# Patient Record
Sex: Male | Born: 1935 | Race: Black or African American | Hispanic: No | Marital: Married | Smoking: Former smoker
Health system: Southern US, Community
[De-identification: ages and names within clinical notes are randomized; demographics above are authoritative.]

## PROBLEM LIST (undated history)

## (undated) DIAGNOSIS — G40909 Epilepsy, unspecified, not intractable, without status epilepticus: Secondary | ICD-10-CM

## (undated) DIAGNOSIS — I739 Peripheral vascular disease, unspecified: Secondary | ICD-10-CM

## (undated) DIAGNOSIS — Z923 Personal history of irradiation: Secondary | ICD-10-CM

## (undated) DIAGNOSIS — R350 Frequency of micturition: Secondary | ICD-10-CM

## (undated) DIAGNOSIS — C411 Malignant neoplasm of mandible: Secondary | ICD-10-CM

## (undated) DIAGNOSIS — F17200 Nicotine dependence, unspecified, uncomplicated: Secondary | ICD-10-CM

## (undated) DIAGNOSIS — G20 Parkinson's disease: Secondary | ICD-10-CM

## (undated) DIAGNOSIS — Z862 Personal history of diseases of the blood and blood-forming organs and certain disorders involving the immune mechanism: Secondary | ICD-10-CM

## (undated) DIAGNOSIS — D126 Benign neoplasm of colon, unspecified: Secondary | ICD-10-CM

## (undated) DIAGNOSIS — C260 Malignant neoplasm of intestinal tract, part unspecified: Secondary | ICD-10-CM

## (undated) DIAGNOSIS — I639 Cerebral infarction, unspecified: Secondary | ICD-10-CM

## (undated) DIAGNOSIS — E785 Hyperlipidemia, unspecified: Secondary | ICD-10-CM

## (undated) HISTORY — DX: Malignant neoplasm of mandible: C41.1

## (undated) HISTORY — DX: Personal history of irradiation: Z92.3

## (undated) HISTORY — DX: Cerebral infarction, unspecified: I63.9

## (undated) HISTORY — PX: OTHER SURGICAL HISTORY: SHX169

## (undated) HISTORY — DX: Parkinson's disease: G20

## (undated) HISTORY — DX: Peripheral vascular disease, unspecified: I73.9

## (undated) HISTORY — DX: Malignant neoplasm of intestinal tract, part unspecified: C26.0

## (undated) HISTORY — DX: Frequency of micturition: R35.0

## (undated) HISTORY — DX: Epilepsy, unspecified, not intractable, without status epilepticus: G40.909

## (undated) HISTORY — DX: Benign neoplasm of colon, unspecified: D12.6

## (undated) HISTORY — DX: Hyperlipidemia, unspecified: E78.5

## (undated) HISTORY — DX: Nicotine dependence, unspecified, uncomplicated: F17.200

## (undated) HISTORY — DX: Personal history of diseases of the blood and blood-forming organs and certain disorders involving the immune mechanism: Z86.2

---

## 1998-10-17 ENCOUNTER — Emergency Department (HOSPITAL_COMMUNITY): Admission: EM | Admit: 1998-10-17 | Discharge: 1998-10-17 | Payer: Self-pay | Admitting: Emergency Medicine

## 1998-10-17 ENCOUNTER — Encounter: Payer: Self-pay | Admitting: Emergency Medicine

## 1999-06-25 ENCOUNTER — Encounter: Payer: Self-pay | Admitting: Emergency Medicine

## 1999-06-25 ENCOUNTER — Inpatient Hospital Stay (HOSPITAL_COMMUNITY): Admission: EM | Admit: 1999-06-25 | Discharge: 1999-07-07 | Payer: Self-pay | Admitting: Emergency Medicine

## 1999-06-27 ENCOUNTER — Encounter: Payer: Self-pay | Admitting: Internal Medicine

## 1999-06-28 ENCOUNTER — Encounter: Payer: Self-pay | Admitting: Internal Medicine

## 1999-07-01 ENCOUNTER — Encounter: Payer: Self-pay | Admitting: Internal Medicine

## 1999-07-02 ENCOUNTER — Encounter: Payer: Self-pay | Admitting: Internal Medicine

## 1999-09-13 ENCOUNTER — Encounter: Admission: RE | Admit: 1999-09-13 | Discharge: 1999-09-13 | Payer: Self-pay | Admitting: Hematology and Oncology

## 1999-09-18 ENCOUNTER — Inpatient Hospital Stay (HOSPITAL_COMMUNITY): Admission: EM | Admit: 1999-09-18 | Discharge: 1999-09-29 | Payer: Self-pay | Admitting: Emergency Medicine

## 1999-09-18 ENCOUNTER — Encounter: Payer: Self-pay | Admitting: Neurology

## 1999-09-18 ENCOUNTER — Encounter: Payer: Self-pay | Admitting: Emergency Medicine

## 1999-10-16 ENCOUNTER — Encounter: Admission: RE | Admit: 1999-10-16 | Discharge: 1999-10-16 | Payer: Self-pay | Admitting: Internal Medicine

## 1999-10-17 ENCOUNTER — Emergency Department (HOSPITAL_COMMUNITY): Admission: EM | Admit: 1999-10-17 | Discharge: 1999-10-17 | Payer: Self-pay | Admitting: Emergency Medicine

## 1999-10-17 ENCOUNTER — Encounter: Payer: Self-pay | Admitting: Emergency Medicine

## 1999-10-23 ENCOUNTER — Encounter: Admission: RE | Admit: 1999-10-23 | Discharge: 1999-10-23 | Payer: Self-pay | Admitting: Internal Medicine

## 1999-10-31 ENCOUNTER — Encounter: Admission: RE | Admit: 1999-10-31 | Discharge: 1999-10-31 | Payer: Self-pay | Admitting: Internal Medicine

## 1999-10-31 ENCOUNTER — Encounter: Payer: Self-pay | Admitting: Neurology

## 1999-10-31 ENCOUNTER — Ambulatory Visit (HOSPITAL_COMMUNITY): Admission: RE | Admit: 1999-10-31 | Discharge: 1999-10-31 | Payer: Self-pay | Admitting: Neurology

## 1999-11-06 ENCOUNTER — Ambulatory Visit (HOSPITAL_COMMUNITY): Admission: RE | Admit: 1999-11-06 | Discharge: 1999-11-06 | Payer: Self-pay | Admitting: Internal Medicine

## 1999-12-01 ENCOUNTER — Encounter: Payer: Self-pay | Admitting: Internal Medicine

## 1999-12-01 ENCOUNTER — Encounter: Payer: Self-pay | Admitting: *Deleted

## 1999-12-01 ENCOUNTER — Inpatient Hospital Stay (HOSPITAL_COMMUNITY): Admission: EM | Admit: 1999-12-01 | Discharge: 1999-12-04 | Payer: Self-pay | Admitting: *Deleted

## 1999-12-11 ENCOUNTER — Encounter: Admission: RE | Admit: 1999-12-11 | Discharge: 1999-12-11 | Payer: Self-pay | Admitting: Internal Medicine

## 2000-01-08 ENCOUNTER — Encounter: Admission: RE | Admit: 2000-01-08 | Discharge: 2000-01-08 | Payer: Self-pay | Admitting: Hematology and Oncology

## 2000-02-26 ENCOUNTER — Encounter: Admission: RE | Admit: 2000-02-26 | Discharge: 2000-02-26 | Payer: Self-pay | Admitting: Internal Medicine

## 2000-03-04 ENCOUNTER — Encounter: Admission: RE | Admit: 2000-03-04 | Discharge: 2000-03-04 | Payer: Self-pay | Admitting: Internal Medicine

## 2000-03-10 ENCOUNTER — Inpatient Hospital Stay (HOSPITAL_COMMUNITY): Admission: EM | Admit: 2000-03-10 | Discharge: 2000-03-12 | Payer: Self-pay | Admitting: Emergency Medicine

## 2000-03-10 ENCOUNTER — Encounter: Payer: Self-pay | Admitting: Emergency Medicine

## 2000-03-10 ENCOUNTER — Encounter: Payer: Self-pay | Admitting: Internal Medicine

## 2000-03-11 ENCOUNTER — Encounter: Payer: Self-pay | Admitting: Internal Medicine

## 2000-03-13 ENCOUNTER — Encounter: Admission: RE | Admit: 2000-03-13 | Discharge: 2000-03-13 | Payer: Self-pay | Admitting: Internal Medicine

## 2000-03-19 ENCOUNTER — Encounter: Admission: RE | Admit: 2000-03-19 | Discharge: 2000-03-19 | Payer: Self-pay | Admitting: Internal Medicine

## 2000-04-16 ENCOUNTER — Encounter: Admission: RE | Admit: 2000-04-16 | Discharge: 2000-04-16 | Payer: Self-pay | Admitting: Hematology and Oncology

## 2000-04-22 ENCOUNTER — Encounter: Admission: RE | Admit: 2000-04-22 | Discharge: 2000-04-22 | Payer: Self-pay | Admitting: Internal Medicine

## 2000-05-14 ENCOUNTER — Encounter: Admission: RE | Admit: 2000-05-14 | Discharge: 2000-05-14 | Payer: Self-pay | Admitting: Hematology and Oncology

## 2000-06-24 ENCOUNTER — Encounter: Admission: RE | Admit: 2000-06-24 | Discharge: 2000-06-24 | Payer: Self-pay | Admitting: Hematology and Oncology

## 2000-07-12 ENCOUNTER — Encounter: Payer: Self-pay | Admitting: Internal Medicine

## 2000-07-12 ENCOUNTER — Inpatient Hospital Stay (HOSPITAL_COMMUNITY): Admission: EM | Admit: 2000-07-12 | Discharge: 2000-07-17 | Payer: Self-pay | Admitting: Emergency Medicine

## 2000-07-12 ENCOUNTER — Encounter: Admission: RE | Admit: 2000-07-12 | Discharge: 2000-07-12 | Payer: Self-pay | Admitting: Internal Medicine

## 2000-08-10 ENCOUNTER — Encounter: Payer: Self-pay | Admitting: Emergency Medicine

## 2000-08-10 ENCOUNTER — Emergency Department (HOSPITAL_COMMUNITY): Admission: EM | Admit: 2000-08-10 | Discharge: 2000-08-10 | Payer: Self-pay | Admitting: Emergency Medicine

## 2000-08-14 ENCOUNTER — Encounter: Admission: RE | Admit: 2000-08-14 | Discharge: 2000-08-14 | Payer: Self-pay | Admitting: Hematology and Oncology

## 2000-09-02 ENCOUNTER — Encounter: Admission: RE | Admit: 2000-09-02 | Discharge: 2000-09-02 | Payer: Self-pay | Admitting: Internal Medicine

## 2000-09-19 ENCOUNTER — Encounter: Admission: RE | Admit: 2000-09-19 | Discharge: 2000-09-19 | Payer: Self-pay | Admitting: Hematology and Oncology

## 2000-10-12 ENCOUNTER — Inpatient Hospital Stay (HOSPITAL_COMMUNITY): Admission: EM | Admit: 2000-10-12 | Discharge: 2000-10-16 | Payer: Self-pay | Admitting: Emergency Medicine

## 2000-10-12 ENCOUNTER — Encounter: Payer: Self-pay | Admitting: Internal Medicine

## 2000-10-12 ENCOUNTER — Encounter: Payer: Self-pay | Admitting: Emergency Medicine

## 2000-10-13 ENCOUNTER — Encounter: Payer: Self-pay | Admitting: Internal Medicine

## 2000-10-29 ENCOUNTER — Encounter: Admission: RE | Admit: 2000-10-29 | Discharge: 2000-10-29 | Payer: Self-pay | Admitting: Internal Medicine

## 2000-11-11 ENCOUNTER — Emergency Department (HOSPITAL_COMMUNITY): Admission: EM | Admit: 2000-11-11 | Discharge: 2000-11-11 | Payer: Self-pay | Admitting: Emergency Medicine

## 2000-11-11 ENCOUNTER — Encounter: Payer: Self-pay | Admitting: Emergency Medicine

## 2000-11-13 ENCOUNTER — Encounter: Admission: RE | Admit: 2000-11-13 | Discharge: 2000-11-13 | Payer: Self-pay | Admitting: Hematology and Oncology

## 2000-11-20 ENCOUNTER — Encounter: Admission: RE | Admit: 2000-11-20 | Discharge: 2000-11-20 | Payer: Self-pay | Admitting: Hematology and Oncology

## 2000-12-06 ENCOUNTER — Encounter: Admission: RE | Admit: 2000-12-06 | Discharge: 2000-12-06 | Payer: Self-pay | Admitting: Internal Medicine

## 2001-01-01 ENCOUNTER — Encounter: Admission: RE | Admit: 2001-01-01 | Discharge: 2001-01-01 | Payer: Self-pay | Admitting: Internal Medicine

## 2001-01-13 ENCOUNTER — Encounter: Admission: RE | Admit: 2001-01-13 | Discharge: 2001-01-13 | Payer: Self-pay | Admitting: Internal Medicine

## 2001-01-29 ENCOUNTER — Encounter: Admission: RE | Admit: 2001-01-29 | Discharge: 2001-01-29 | Payer: Self-pay | Admitting: Internal Medicine

## 2001-02-17 ENCOUNTER — Encounter: Admission: RE | Admit: 2001-02-17 | Discharge: 2001-02-17 | Payer: Self-pay | Admitting: Internal Medicine

## 2001-03-03 ENCOUNTER — Encounter: Admission: RE | Admit: 2001-03-03 | Discharge: 2001-03-03 | Payer: Self-pay | Admitting: Internal Medicine

## 2001-06-08 ENCOUNTER — Emergency Department (HOSPITAL_COMMUNITY): Admission: EM | Admit: 2001-06-08 | Discharge: 2001-06-08 | Payer: Self-pay

## 2001-07-01 ENCOUNTER — Inpatient Hospital Stay (HOSPITAL_COMMUNITY): Admission: EM | Admit: 2001-07-01 | Discharge: 2001-07-03 | Payer: Self-pay

## 2001-07-01 ENCOUNTER — Encounter: Payer: Self-pay | Admitting: Emergency Medicine

## 2001-07-02 ENCOUNTER — Encounter: Payer: Self-pay | Admitting: *Deleted

## 2001-07-16 ENCOUNTER — Encounter: Admission: RE | Admit: 2001-07-16 | Discharge: 2001-07-16 | Payer: Self-pay | Admitting: Internal Medicine

## 2001-10-08 ENCOUNTER — Encounter: Admission: RE | Admit: 2001-10-08 | Discharge: 2001-10-08 | Payer: Self-pay | Admitting: Internal Medicine

## 2001-11-01 ENCOUNTER — Observation Stay (HOSPITAL_COMMUNITY): Admission: EM | Admit: 2001-11-01 | Discharge: 2001-11-02 | Payer: Self-pay | Admitting: *Deleted

## 2001-11-01 ENCOUNTER — Encounter: Payer: Self-pay | Admitting: Orthopedic Surgery

## 2001-11-02 ENCOUNTER — Encounter: Payer: Self-pay | Admitting: Orthopedic Surgery

## 2002-01-14 ENCOUNTER — Encounter: Admission: RE | Admit: 2002-01-14 | Discharge: 2002-01-14 | Payer: Self-pay | Admitting: Internal Medicine

## 2002-02-04 ENCOUNTER — Encounter (INDEPENDENT_AMBULATORY_CARE_PROVIDER_SITE_OTHER): Payer: Self-pay | Admitting: Pulmonary Disease

## 2002-02-04 LAB — CONVERTED CEMR LAB: PSA: 0.86 ng/mL

## 2002-08-16 ENCOUNTER — Emergency Department (HOSPITAL_COMMUNITY): Admission: EM | Admit: 2002-08-16 | Discharge: 2002-08-16 | Payer: Self-pay | Admitting: Emergency Medicine

## 2003-08-13 ENCOUNTER — Emergency Department (HOSPITAL_COMMUNITY): Admission: AD | Admit: 2003-08-13 | Discharge: 2003-08-13 | Payer: Self-pay | Admitting: Internal Medicine

## 2004-02-16 ENCOUNTER — Encounter: Admission: RE | Admit: 2004-02-16 | Discharge: 2004-02-16 | Payer: Self-pay | Admitting: Internal Medicine

## 2004-03-03 ENCOUNTER — Encounter: Admission: RE | Admit: 2004-03-03 | Discharge: 2004-03-03 | Payer: Self-pay | Admitting: Internal Medicine

## 2005-07-27 ENCOUNTER — Emergency Department (HOSPITAL_COMMUNITY): Admission: EM | Admit: 2005-07-27 | Discharge: 2005-07-27 | Payer: Self-pay | Admitting: Emergency Medicine

## 2005-07-28 ENCOUNTER — Emergency Department (HOSPITAL_COMMUNITY): Admission: EM | Admit: 2005-07-28 | Discharge: 2005-07-28 | Payer: Self-pay | Admitting: Emergency Medicine

## 2005-07-29 ENCOUNTER — Emergency Department (HOSPITAL_COMMUNITY): Admission: EM | Admit: 2005-07-29 | Discharge: 2005-07-29 | Payer: Self-pay | Admitting: Emergency Medicine

## 2005-10-29 ENCOUNTER — Ambulatory Visit: Payer: Self-pay | Admitting: Hospitalist

## 2005-11-05 ENCOUNTER — Ambulatory Visit (HOSPITAL_COMMUNITY): Admission: RE | Admit: 2005-11-05 | Discharge: 2005-11-05 | Payer: Self-pay | Admitting: Cardiology

## 2005-11-05 ENCOUNTER — Encounter: Payer: Self-pay | Admitting: Vascular Surgery

## 2005-11-07 ENCOUNTER — Ambulatory Visit: Payer: Self-pay | Admitting: Internal Medicine

## 2005-11-08 ENCOUNTER — Encounter (INDEPENDENT_AMBULATORY_CARE_PROVIDER_SITE_OTHER): Payer: Self-pay | Admitting: Pulmonary Disease

## 2005-11-08 LAB — CONVERTED CEMR LAB: PSA: 1.02 ng/mL

## 2005-11-16 ENCOUNTER — Encounter (INDEPENDENT_AMBULATORY_CARE_PROVIDER_SITE_OTHER): Payer: Self-pay | Admitting: Pulmonary Disease

## 2005-11-20 ENCOUNTER — Ambulatory Visit: Payer: Self-pay | Admitting: Hospitalist

## 2006-06-24 ENCOUNTER — Encounter (INDEPENDENT_AMBULATORY_CARE_PROVIDER_SITE_OTHER): Payer: Self-pay | Admitting: Pulmonary Disease

## 2006-06-24 DIAGNOSIS — F17200 Nicotine dependence, unspecified, uncomplicated: Secondary | ICD-10-CM

## 2006-06-24 DIAGNOSIS — R569 Unspecified convulsions: Secondary | ICD-10-CM

## 2006-06-24 DIAGNOSIS — I739 Peripheral vascular disease, unspecified: Secondary | ICD-10-CM | POA: Insufficient documentation

## 2006-06-24 DIAGNOSIS — G40909 Epilepsy, unspecified, not intractable, without status epilepticus: Secondary | ICD-10-CM

## 2006-06-24 HISTORY — DX: Peripheral vascular disease, unspecified: I73.9

## 2006-06-24 HISTORY — DX: Nicotine dependence, unspecified, uncomplicated: F17.200

## 2006-06-24 HISTORY — DX: Epilepsy, unspecified, not intractable, without status epilepticus: G40.909

## 2006-08-26 ENCOUNTER — Encounter (INDEPENDENT_AMBULATORY_CARE_PROVIDER_SITE_OTHER): Payer: Self-pay | Admitting: Pulmonary Disease

## 2006-08-26 ENCOUNTER — Ambulatory Visit: Payer: Self-pay | Admitting: Internal Medicine

## 2006-08-26 LAB — CONVERTED CEMR LAB
ALT: 13 units/L (ref 0–53)
AST: 14 units/L (ref 0–37)
Albumin: 4.3 g/dL (ref 3.5–5.2)
Calcium: 9.5 mg/dL (ref 8.4–10.5)
Chloride: 106 meq/L (ref 96–112)
Hemoglobin: 15.1 g/dL (ref 13.0–17.0)
Platelets: 308 10*3/uL (ref 150–400)
Potassium: 4 meq/L (ref 3.5–5.3)
RDW: 13.9 % (ref 11.5–14.0)
Sodium: 142 meq/L (ref 135–145)
Total Protein: 7.3 g/dL (ref 6.0–8.3)

## 2006-09-09 ENCOUNTER — Ambulatory Visit: Payer: Self-pay | Admitting: Gastroenterology

## 2006-09-17 ENCOUNTER — Ambulatory Visit (HOSPITAL_COMMUNITY): Admission: RE | Admit: 2006-09-17 | Discharge: 2006-09-17 | Payer: Self-pay | Admitting: Gastroenterology

## 2006-09-17 ENCOUNTER — Encounter (INDEPENDENT_AMBULATORY_CARE_PROVIDER_SITE_OTHER): Payer: Self-pay | Admitting: Specialist

## 2006-09-17 ENCOUNTER — Encounter: Payer: Self-pay | Admitting: Gastroenterology

## 2006-09-17 DIAGNOSIS — D126 Benign neoplasm of colon, unspecified: Secondary | ICD-10-CM

## 2006-09-17 DIAGNOSIS — C260 Malignant neoplasm of intestinal tract, part unspecified: Secondary | ICD-10-CM

## 2006-09-17 HISTORY — DX: Benign neoplasm of colon, unspecified: D12.6

## 2006-09-17 HISTORY — DX: Malignant neoplasm of intestinal tract, part unspecified: C26.0

## 2006-09-20 ENCOUNTER — Ambulatory Visit: Payer: Self-pay | Admitting: Gastroenterology

## 2006-10-09 ENCOUNTER — Encounter: Admission: RE | Admit: 2006-10-09 | Discharge: 2006-10-09 | Payer: Self-pay | Admitting: General Surgery

## 2006-11-05 ENCOUNTER — Inpatient Hospital Stay (HOSPITAL_COMMUNITY): Admission: RE | Admit: 2006-11-05 | Discharge: 2006-11-21 | Payer: Self-pay | Admitting: General Surgery

## 2006-11-05 ENCOUNTER — Encounter (INDEPENDENT_AMBULATORY_CARE_PROVIDER_SITE_OTHER): Payer: Self-pay | Admitting: *Deleted

## 2007-03-21 IMAGING — CT CT ABDOMEN W/ CM
2 of 5 series · 17 of 46 positions shown, 19 images · IV contrast (READICAT/WATER & [ID] OMNI 300)
Comparison: none

CLINICAL DATA: Colon mass discovered on colonoscopy recently.  
CT ABDOMEN AND PELVIS WITH CONTRAST:
TECHNIQUE: Multidetector CT imaging of the abdomen and pelvis was performed following the standard protocol during bolus administration of intravenous contrast.
Contrast:  889cc Omnipaque 300.
CT ABDOMEN WITH CONTRAST:

[Series 3: routine abdomen · axial · 0.78mm/px · z∈[-410,+25]mm · 14 of 99 slices shown, 16 images]
[im 6/99  soft-tissue]
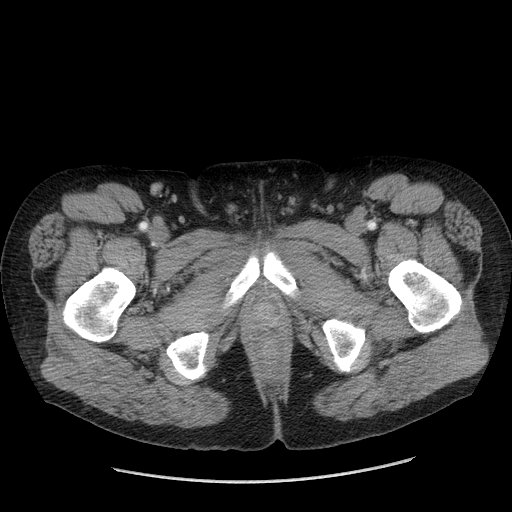
[im 6/99  bone]
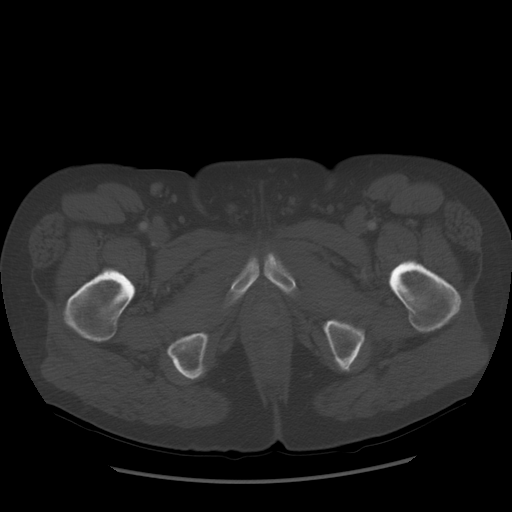
[im 11/99  soft-tissue]
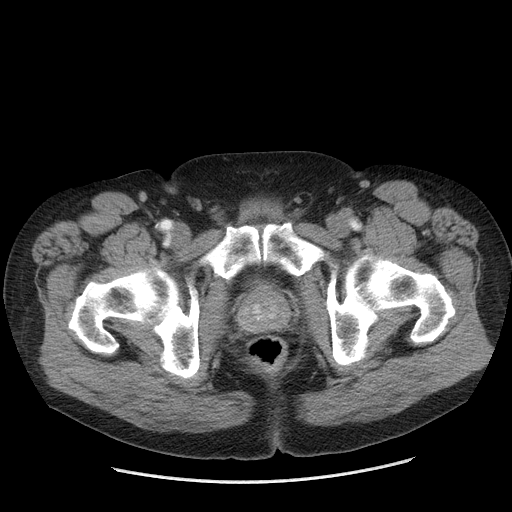
[im 21/99  soft-tissue]
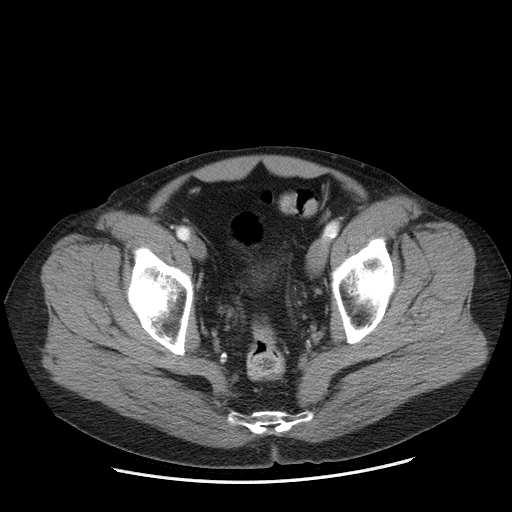
[im 26/99  soft-tissue]
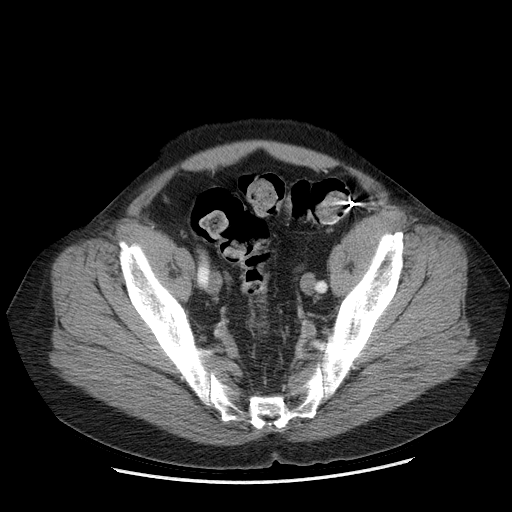
[im 31/99  soft-tissue]
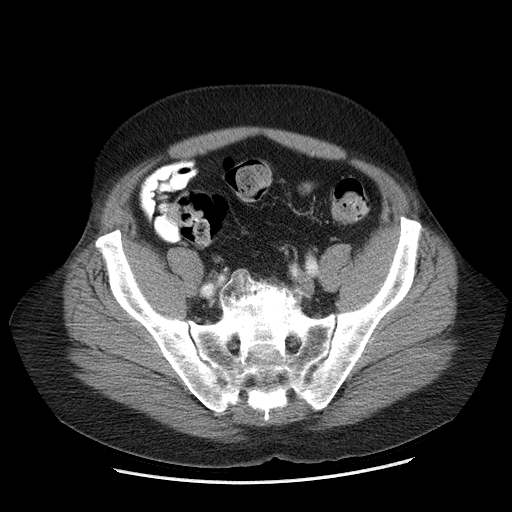
[im 42/99  soft-tissue]
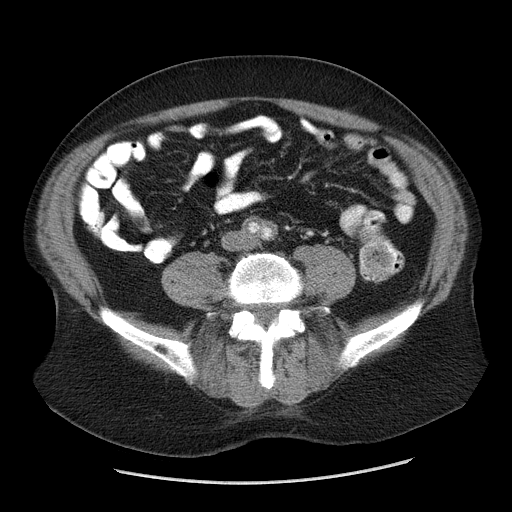
[im 47/99  soft-tissue]
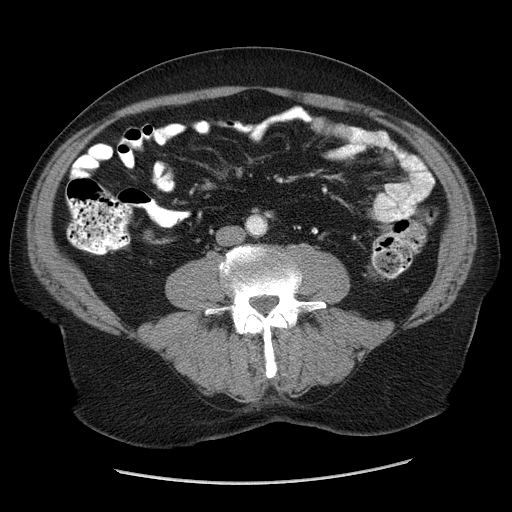
[im 52/99  soft-tissue]
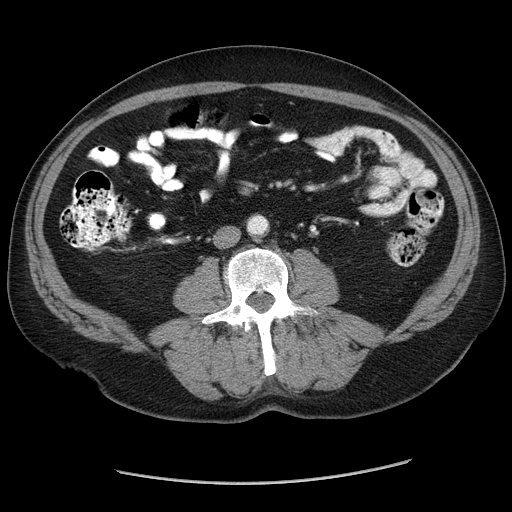
[im 57/99  soft-tissue]
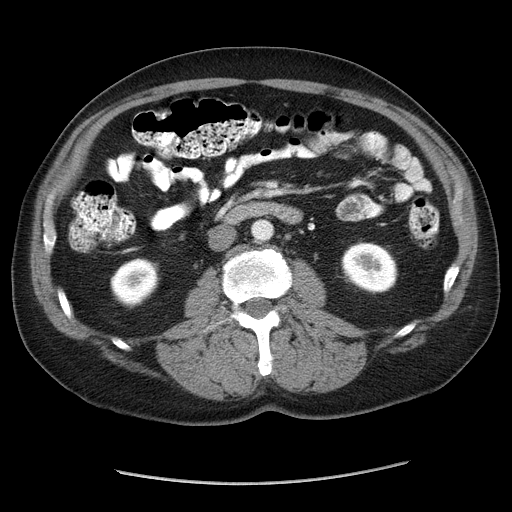
[im 57/99  bone]
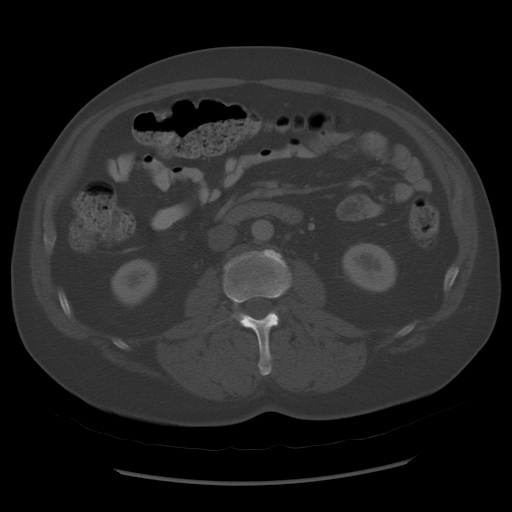
[im 68/99  soft-tissue]
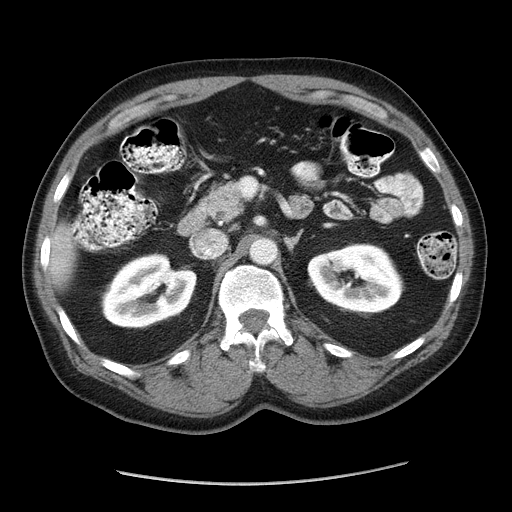
[im 73/99  soft-tissue]
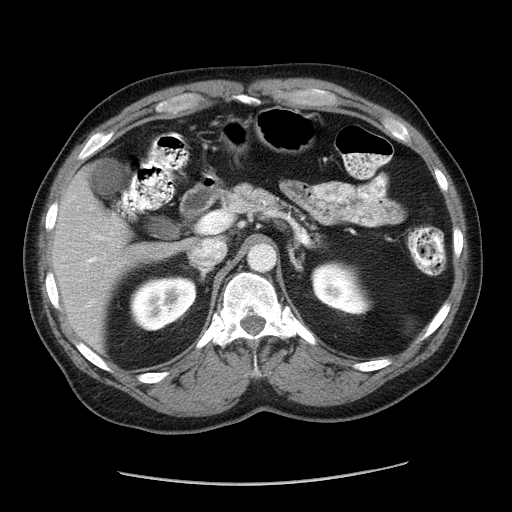
[im 78/99  soft-tissue]
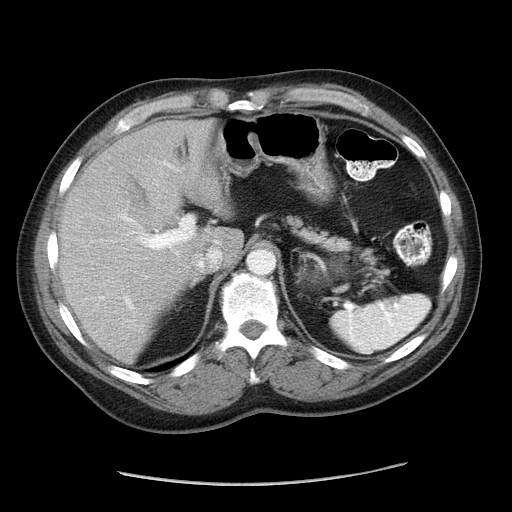
[im 88/99  soft-tissue]
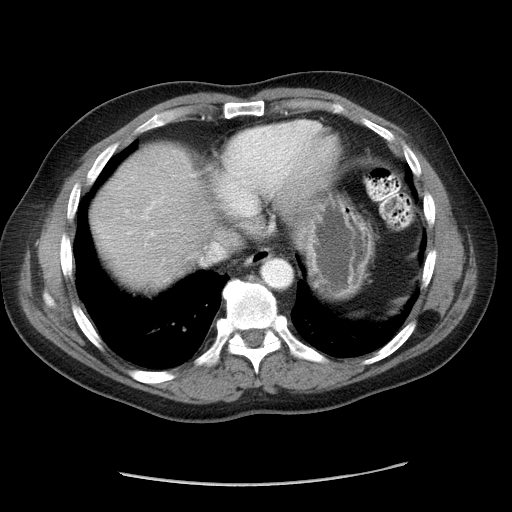
[im 93/99  soft-tissue]
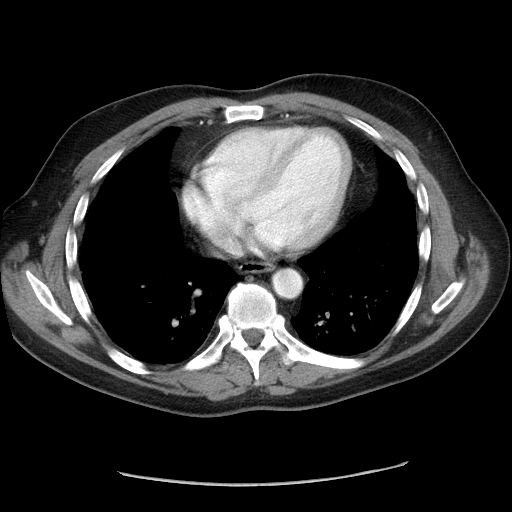

[Series 602: sagittal body · sagittal · 1.02mm/px · 3 of 161 slices shown]
[im 54/161  soft-tissue]
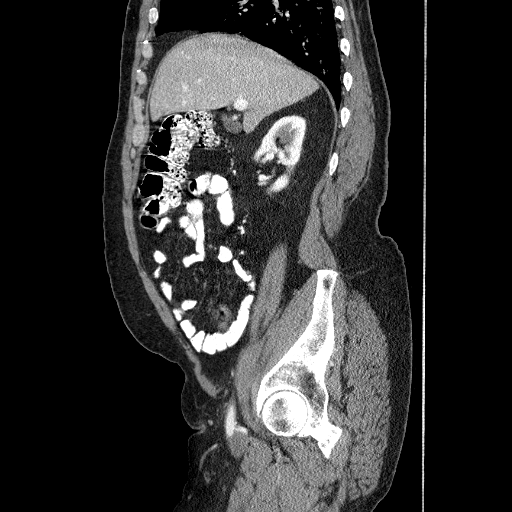
[im 72/161  soft-tissue]
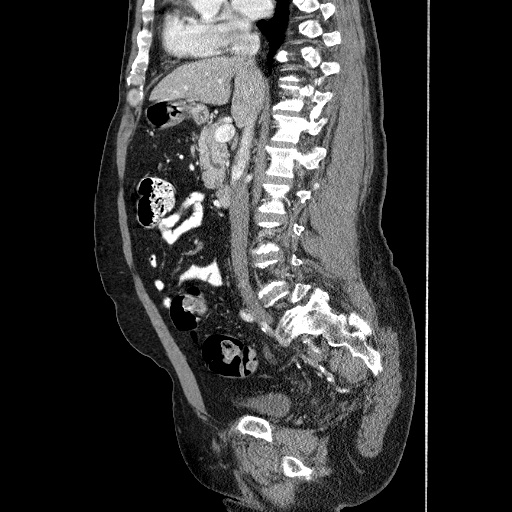
[im 89/161  soft-tissue]
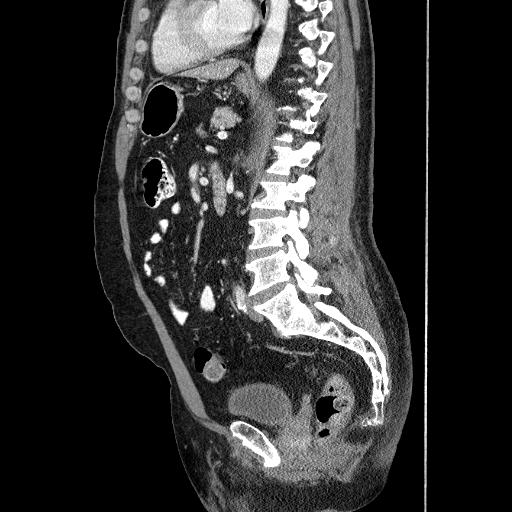

[17 of 46 positions shown; findings below may reference images not displayed]

FINDINGS: Mild basilar atelectasis is present at both lung bases.  No effusion is seen.  The liver enhances with no evidence of metastatic involvement.  There appear to be small calcified gallstones layering in the neck of the gallbladder.  Pancreas is normal in size with normal peripancreatic fat planes.  The adrenal glands and spleen appear normal.  The kidneys enhance and on delayed images the pelvocaliceal systems appear normal.  The proximal ureters are normal in caliber.  The abdominal aorta appears normal.  No adenopathy is seen.
IMPRESSION: 1.  Negative CT of the abdomen.  No metastatic involvement. 
2.  Small gallstones.  
CT PELVIS WITH CONTRAST:
FINDINGS: The urinary bladder is not well distended but no intraluminal abnormality is seen.  The prostate is normal in size for age.  There is a clip within the proximal sigmoid colon but no definite mass is seen at that site.  There is feces throughout the colon but no definite colonic mass is noted by CT.   The terminal ileum appears normal.  No adenopathy is seen.  There is degenerative change at L5-S1.  Atheromatous change is noted in the distal abdominal aorta extending into both common iliac arteries proximally.  No bony abnormality is seen.
IMPRESSION: 1.  There is feces throughout the colon but no definite colonic mass is seen by CT.  There is a surgical clip in the proximal sigmoid colon. 
2.  No adenopathy.  
3.  Degenerative change at L5-S1.

## 2007-06-30 ENCOUNTER — Ambulatory Visit: Payer: Self-pay | Admitting: *Deleted

## 2007-06-30 DIAGNOSIS — G20 Parkinson's disease: Secondary | ICD-10-CM

## 2007-06-30 DIAGNOSIS — I639 Cerebral infarction, unspecified: Secondary | ICD-10-CM

## 2007-06-30 DIAGNOSIS — G20A1 Parkinson's disease without dyskinesia, without mention of fluctuations: Secondary | ICD-10-CM

## 2007-06-30 DIAGNOSIS — I6350 Cerebral infarction due to unspecified occlusion or stenosis of unspecified cerebral artery: Secondary | ICD-10-CM

## 2007-06-30 HISTORY — DX: Parkinson's disease without dyskinesia, without mention of fluctuations: G20.A1

## 2007-06-30 HISTORY — DX: Parkinson's disease: G20

## 2007-06-30 HISTORY — DX: Cerebral infarction, unspecified: I63.9

## 2007-07-15 ENCOUNTER — Telehealth (INDEPENDENT_AMBULATORY_CARE_PROVIDER_SITE_OTHER): Payer: Self-pay | Admitting: *Deleted

## 2007-07-15 ENCOUNTER — Ambulatory Visit (HOSPITAL_COMMUNITY): Admission: RE | Admit: 2007-07-15 | Discharge: 2007-07-15 | Payer: Self-pay | Admitting: *Deleted

## 2007-07-15 ENCOUNTER — Ambulatory Visit: Payer: Self-pay | Admitting: Surgery

## 2007-07-15 ENCOUNTER — Ambulatory Visit: Payer: Self-pay | Admitting: Internal Medicine

## 2007-07-15 ENCOUNTER — Encounter (INDEPENDENT_AMBULATORY_CARE_PROVIDER_SITE_OTHER): Payer: Self-pay | Admitting: Internal Medicine

## 2007-07-15 ENCOUNTER — Encounter (INDEPENDENT_AMBULATORY_CARE_PROVIDER_SITE_OTHER): Payer: Self-pay | Admitting: *Deleted

## 2007-07-15 LAB — CONVERTED CEMR LAB
Calcium: 9.6 mg/dL (ref 8.4–10.5)
Cholesterol: 201 mg/dL — ABNORMAL HIGH (ref 0–200)
MCHC: 32.4 g/dL (ref 30.0–36.0)
Potassium: 4.4 meq/L (ref 3.5–5.3)
RDW: 14.2 % (ref 11.5–15.5)
Sodium: 144 meq/L (ref 135–145)
Total CHOL/HDL Ratio: 5.7
Triglycerides: 134 mg/dL (ref <150)
VLDL: 27 mg/dL (ref 0–40)

## 2007-07-21 ENCOUNTER — Ambulatory Visit: Payer: Self-pay | Admitting: Internal Medicine

## 2007-07-29 ENCOUNTER — Ambulatory Visit (HOSPITAL_COMMUNITY): Admission: RE | Admit: 2007-07-29 | Discharge: 2007-07-29 | Payer: Self-pay | Admitting: *Deleted

## 2007-07-31 ENCOUNTER — Telehealth: Payer: Self-pay | Admitting: *Deleted

## 2007-08-04 ENCOUNTER — Ambulatory Visit: Payer: Self-pay

## 2007-08-21 ENCOUNTER — Ambulatory Visit: Payer: Self-pay | Admitting: *Deleted

## 2007-08-26 ENCOUNTER — Telehealth: Payer: Self-pay | Admitting: Infectious Disease

## 2007-10-10 ENCOUNTER — Telehealth (INDEPENDENT_AMBULATORY_CARE_PROVIDER_SITE_OTHER): Payer: Self-pay | Admitting: *Deleted

## 2007-10-15 ENCOUNTER — Ambulatory Visit: Payer: Self-pay | Admitting: Internal Medicine

## 2007-10-15 ENCOUNTER — Encounter (INDEPENDENT_AMBULATORY_CARE_PROVIDER_SITE_OTHER): Payer: Self-pay | Admitting: *Deleted

## 2007-10-15 LAB — CONVERTED CEMR LAB
BUN: 6 mg/dL (ref 6–23)
Chloride: 103 meq/L (ref 96–112)
Creatinine, Ser: 0.84 mg/dL (ref 0.40–1.50)
Glucose, Bld: 96 mg/dL (ref 70–99)

## 2007-11-14 ENCOUNTER — Encounter (INDEPENDENT_AMBULATORY_CARE_PROVIDER_SITE_OTHER): Payer: Self-pay | Admitting: *Deleted

## 2007-11-19 ENCOUNTER — Encounter (INDEPENDENT_AMBULATORY_CARE_PROVIDER_SITE_OTHER): Payer: Self-pay | Admitting: *Deleted

## 2007-11-24 ENCOUNTER — Encounter (INDEPENDENT_AMBULATORY_CARE_PROVIDER_SITE_OTHER): Payer: Self-pay | Admitting: *Deleted

## 2007-11-26 ENCOUNTER — Encounter (INDEPENDENT_AMBULATORY_CARE_PROVIDER_SITE_OTHER): Payer: Self-pay | Admitting: *Deleted

## 2007-12-05 ENCOUNTER — Encounter (INDEPENDENT_AMBULATORY_CARE_PROVIDER_SITE_OTHER): Payer: Self-pay | Admitting: *Deleted

## 2007-12-16 ENCOUNTER — Encounter (INDEPENDENT_AMBULATORY_CARE_PROVIDER_SITE_OTHER): Payer: Self-pay | Admitting: *Deleted

## 2007-12-24 ENCOUNTER — Encounter (INDEPENDENT_AMBULATORY_CARE_PROVIDER_SITE_OTHER): Payer: Self-pay | Admitting: *Deleted

## 2007-12-30 ENCOUNTER — Ambulatory Visit: Payer: Self-pay | Admitting: Oncology

## 2007-12-31 ENCOUNTER — Ambulatory Visit: Admission: RE | Admit: 2007-12-31 | Discharge: 2008-03-22 | Payer: Self-pay | Admitting: Radiation Oncology

## 2008-01-13 ENCOUNTER — Ambulatory Visit: Payer: Self-pay | Admitting: Dentistry

## 2008-01-13 ENCOUNTER — Encounter: Admission: AD | Admit: 2008-01-13 | Discharge: 2008-01-13 | Payer: Self-pay | Admitting: Dentistry

## 2008-03-22 ENCOUNTER — Ambulatory Visit: Admission: RE | Admit: 2008-03-22 | Discharge: 2008-04-27 | Payer: Self-pay | Admitting: Radiation Oncology

## 2008-05-27 ENCOUNTER — Ambulatory Visit: Admission: RE | Admit: 2008-05-27 | Discharge: 2008-05-27 | Payer: Self-pay | Admitting: Radiation Oncology

## 2008-05-27 ENCOUNTER — Encounter (INDEPENDENT_AMBULATORY_CARE_PROVIDER_SITE_OTHER): Payer: Self-pay | Admitting: *Deleted

## 2008-07-06 ENCOUNTER — Encounter (INDEPENDENT_AMBULATORY_CARE_PROVIDER_SITE_OTHER): Payer: Self-pay | Admitting: *Deleted

## 2008-07-07 DIAGNOSIS — C411 Malignant neoplasm of mandible: Secondary | ICD-10-CM | POA: Insufficient documentation

## 2008-07-07 HISTORY — DX: Malignant neoplasm of mandible: C41.1

## 2008-08-30 ENCOUNTER — Encounter (INDEPENDENT_AMBULATORY_CARE_PROVIDER_SITE_OTHER): Payer: Self-pay | Admitting: *Deleted

## 2008-08-30 ENCOUNTER — Ambulatory Visit: Payer: Self-pay | Admitting: Internal Medicine

## 2008-08-30 DIAGNOSIS — R350 Frequency of micturition: Secondary | ICD-10-CM

## 2008-08-30 DIAGNOSIS — Z862 Personal history of diseases of the blood and blood-forming organs and certain disorders involving the immune mechanism: Secondary | ICD-10-CM

## 2008-08-30 HISTORY — DX: Frequency of micturition: R35.0

## 2008-08-30 HISTORY — DX: Personal history of diseases of the blood and blood-forming organs and certain disorders involving the immune mechanism: Z86.2

## 2008-08-30 LAB — CONVERTED CEMR LAB
Bilirubin Urine: NEGATIVE
Hemoglobin, Urine: NEGATIVE
Protein, ur: NEGATIVE mg/dL
Urobilinogen, UA: 0.2 (ref 0.0–1.0)

## 2008-08-31 ENCOUNTER — Ambulatory Visit: Payer: Self-pay | Admitting: Infectious Disease

## 2008-08-31 ENCOUNTER — Encounter (INDEPENDENT_AMBULATORY_CARE_PROVIDER_SITE_OTHER): Payer: Self-pay | Admitting: *Deleted

## 2008-08-31 LAB — CONVERTED CEMR LAB: Blood Glucose, AC Bkfst: 84 mg/dL

## 2008-09-01 DIAGNOSIS — E785 Hyperlipidemia, unspecified: Secondary | ICD-10-CM

## 2008-09-01 HISTORY — DX: Hyperlipidemia, unspecified: E78.5

## 2008-09-07 LAB — CONVERTED CEMR LAB
BUN: 7 mg/dL (ref 6–23)
CO2: 25 meq/L (ref 19–32)
Calcium: 9.6 mg/dL (ref 8.4–10.5)
Chloride: 106 meq/L (ref 96–112)
Cholesterol: 181 mg/dL (ref 0–200)
Creatinine, Ser: 0.79 mg/dL (ref 0.40–1.50)
Glucose, Bld: 88 mg/dL (ref 70–99)
HCT: 42.2 % (ref 39.0–52.0)
HDL: 39 mg/dL — ABNORMAL LOW (ref >39)
Hemoglobin: 13.7 g/dL (ref 13.0–17.0)
MCV: 93.4 fL (ref 78.0–100.0)
Platelets: 255 10*3/uL (ref 150–400)
Total Bilirubin: 0.7 mg/dL (ref 0.3–1.2)
Total CHOL/HDL Ratio: 4.6
Triglycerides: 129 mg/dL (ref <150)
VLDL: 26 mg/dL (ref 0–40)
WBC: 4.6 10*3/uL (ref 4.0–10.5)

## 2008-09-16 ENCOUNTER — Ambulatory Visit: Payer: Self-pay

## 2008-09-16 ENCOUNTER — Encounter (INDEPENDENT_AMBULATORY_CARE_PROVIDER_SITE_OTHER): Payer: Self-pay | Admitting: *Deleted

## 2008-09-16 DIAGNOSIS — J309 Allergic rhinitis, unspecified: Secondary | ICD-10-CM | POA: Insufficient documentation

## 2008-09-16 LAB — CONVERTED CEMR LAB
Alkaline Phosphatase: 52 units/L (ref 39–117)
BUN: 6 mg/dL (ref 6–23)
CO2: 30 meq/L (ref 19–32)
Glucose, Bld: 97 mg/dL (ref 70–99)
Sodium: 142 meq/L (ref 135–145)
Total Bilirubin: 0.5 mg/dL (ref 0.3–1.2)
Total Protein: 7.1 g/dL (ref 6.0–8.3)

## 2008-09-22 ENCOUNTER — Ambulatory Visit: Payer: Self-pay | Admitting: Gastroenterology

## 2008-10-26 ENCOUNTER — Ambulatory Visit: Payer: Self-pay | Admitting: Gastroenterology

## 2008-10-28 ENCOUNTER — Emergency Department (HOSPITAL_COMMUNITY): Admission: EM | Admit: 2008-10-28 | Discharge: 2008-10-28 | Payer: Self-pay | Admitting: Emergency Medicine

## 2008-11-01 ENCOUNTER — Encounter (INDEPENDENT_AMBULATORY_CARE_PROVIDER_SITE_OTHER): Payer: Self-pay | Admitting: *Deleted

## 2008-11-01 ENCOUNTER — Ambulatory Visit: Payer: Self-pay | Admitting: Internal Medicine

## 2008-11-01 LAB — CONVERTED CEMR LAB
Cholesterol: 104 mg/dL (ref 0–200)
HDL: 32 mg/dL — ABNORMAL LOW (ref >39)
LDL Cholesterol: 51 mg/dL (ref 0–99)
Triglycerides: 104 mg/dL (ref <150)
VLDL: 21 mg/dL (ref 0–40)

## 2008-11-29 ENCOUNTER — Encounter (INDEPENDENT_AMBULATORY_CARE_PROVIDER_SITE_OTHER): Payer: Self-pay | Admitting: *Deleted

## 2009-02-21 ENCOUNTER — Encounter: Payer: Self-pay | Admitting: Internal Medicine

## 2009-03-25 ENCOUNTER — Ambulatory Visit: Admission: RE | Admit: 2009-03-25 | Discharge: 2009-05-30 | Payer: Self-pay | Admitting: Radiation Oncology

## 2009-05-10 ENCOUNTER — Encounter: Payer: Self-pay | Admitting: Internal Medicine

## 2009-07-18 ENCOUNTER — Encounter: Payer: Self-pay | Admitting: Internal Medicine

## 2009-09-19 ENCOUNTER — Encounter: Payer: Self-pay | Admitting: Internal Medicine

## 2009-11-15 ENCOUNTER — Ambulatory Visit: Payer: Self-pay | Admitting: Internal Medicine

## 2009-11-15 LAB — CONVERTED CEMR LAB
AST: 12 units/L (ref 0–37)
BUN: 7 mg/dL (ref 6–23)
CO2: 29 meq/L (ref 19–32)
Calcium: 10 mg/dL (ref 8.4–10.5)
Chloride: 101 meq/L (ref 96–112)
Cholesterol: 193 mg/dL (ref 0–200)
Creatinine, Ser: 0.96 mg/dL (ref 0.40–1.50)
Ferritin: 288 ng/mL (ref 22–322)
HDL: 36 mg/dL — ABNORMAL LOW (ref >39)
Total Bilirubin: 0.5 mg/dL (ref 0.3–1.2)
Total CHOL/HDL Ratio: 5.4
VLDL: 32 mg/dL (ref 0–40)

## 2010-05-09 ENCOUNTER — Telehealth: Payer: Self-pay | Admitting: *Deleted

## 2010-05-23 ENCOUNTER — Encounter: Payer: Self-pay | Admitting: Internal Medicine

## 2010-07-24 ENCOUNTER — Ambulatory Visit: Payer: Self-pay | Admitting: Internal Medicine

## 2010-07-24 LAB — CONVERTED CEMR LAB

## 2010-09-14 NOTE — Assessment & Plan Note (Signed)
Vital Signs:  Patient profile:   77 year old male Height:      70 inches (177.80 cm) Weight:      196.0 pounds (89.09 kg) BMI:     28.22 Temp:     97.0 degrees F (36.11 degrees C) oral Pulse rate:   64 / minute BP sitting:   116 / 65  (left arm)  Vitals Entered By: Stanton Kidney Ditzler RN (July 24, 2010 2:55 PM) Is Patient Diabetic? No Pain Assessment Patient in pain? yes     Location: left side of face Intensity: 3 Type: pain Onset of pain  long time Nutritional Status BMI of 25 - 29 = overweight Nutritional Status Detail appetite good CBG Result 99  Have you ever been in a relationship where you felt threatened, hurt or afraid?denies   Does patient need assistance? Functional Status Self care Ambulation Normal Comments Wife and daughter  with pt. FU and discuss glucose.   Primary Care Prakriti Carignan:  Rufina Falco MD    History of Present Illness: PT is a 77 year old man with PMH significant for left mandibular scarcomatoid cancer, Dyslipidemia, iron deficiency anemia, CVA, seizure disorder, PAD, and Parkinson's disease who presents to clinic for f/u.  Left mandibular scarcomatoid cancer treated in [place] in 2009 with radiation through 2010, and is in remission, he was last seen in 06/2010 and informed that there are no signs of recurrence. pt denies pain or any other compaints,   Patient has no new complaints to discuss, and is only requesting refills.   Patient denies SOB, CP, N,V,C,D, is otherwise doing well, and denies any other complaints.    Depression History:      The patient denies a depressed mood most of the day and a diminished interest in his usual daily activities.         Preventive Screening-Counseling & Management  Alcohol-Tobacco     Smoking Status: quit     Smoking Cessation Counseling: yes     Packs/Day: 1/4     Year Started: smoking for 50 yrs     Year Quit: 4/09 - jaw ca  Caffeine-Diet-Exercise     Does Patient Exercise: no  Current  Medications (verified): 1)  Keppra 1000 Mg Tabs (Levetiracetam) .... Take 1 Tablet By Mouth Two Times A Day 2)  Aggrenox 25-200 Mg Cp12 (Aspirin-Dipyridamole) .... Take 1 Tablet By Mouth Two Times A Day 3)  Requip 0.25 Mg Tabs (Ropinirole Hydrochloride) .... Take 1 Tablet By Mouth Three Times A Day 4)  Fe Tabs 325 (65 Fe) Mg Tbec (Ferrous Sulfate) .... Take 1 Tablet By Mouth Once A Day 5)  Pravachol 20 Mg Tabs (Pravastatin Sodium) .... Take 1 Tablet By Mouth Once A Day At Bedtime 6)  Flonase 50 Mcg/act Susp (Fluticasone Propionate) .... 2 Sprays in Each Nostril One Time A Day 7)  Cvs Stool Softener 100 Mg Caps (Docusate Sodium) .Marland Kitchen.. 1 By Mouth Once Daily  Allergies (verified): No Known Drug Allergies  Review of Systems       Per HPI  Physical Exam  General:  alert, well-developed, and cooperative to examination.    Head:  atraumatic.  jaw deformity again noted from ca on left side, no progression of lesion.  Lungs:  normal respiratory effort, no accessory muscle use, normal breath sounds, no crackles, and no wheezes.  Heart:  normal rate, regular rhythm, no murmur, no gallop, and no rub.    Abdomen:  soft, non-tender, normal bowel sounds, no distention,  no guarding, no rebound tenderness, no hepatomegaly, and no splenomegaly.    Neurologic:  alert & oriented X3, cranial nerves II-XII intact, strength normal in all extremities, sensation intact to light touch, and gait normal.       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 will contine to follow along with [facility].   Orders: T- Capillary Blood Glucose ([id number])  Problem # 2:  DYSLIPIDEMIA  (ICD-272.4) Assessment: Comment Only he is within target range of LDL given his risk factors, will check again next year his LFT and FLP.  His updated medication list for this problem includes:    Pravachol 20 Mg Tabs (Pravastatin sodium) .Marland Kitchen... Take 1 tablet by mouth once a day at bedtime  Labs Reviewed: SGOT: 12 (11/15/2009)   SGPT: 11 (11/15/2009)   HDL:36 (11/15/2009), 32 (11/01/2008)  LDL:125 (11/15/2009), 51 (24/40/1027)  Chol:193 (11/15/2009), 104 (11/01/2008)  Trig:161 (11/15/2009), 104 (11/01/2008)  Problem # 3:  SMOKER (ICD-305.1) Assessment: Comment Only Quit.   Problem # 4:  PARKINSON'S DISEASE (ICD-332.0) Assessment: Comment Only Well controlled on current treatment, No new changes made today, Will continue to monitor.   Orders: T- Capillary Blood Glucose ([id number])  Complete Medication List: 1)  Keppra 1000 Mg Tabs (Levetiracetam) .... Take 1 tablet by mouth two times a day 2)  Aggrenox 25-200 Mg Cp12 (Aspirin-dipyridamole) .... Take 1 tablet by mouth two times a day 3)  Requip 0.25 Mg Tabs (Ropinirole hydrochloride) .... Take 1 tablet by mouth three times a day 4)  Fe Tabs 325 (65 Fe) Mg Tbec (Ferrous sulfate) .... Take 1 tablet by mouth once a day 5)  Pravachol 20 Mg Tabs (Pravastatin sodium) .... Take 1 tablet by mouth once a day at bedtime 6)  Flonase 50 Mcg/act Susp (Fluticasone propionate) .... 2 sprays in each nostril one time a day 7)  Cvs Stool Softener 100 Mg Caps (Docusate sodium) .Marland Kitchen.. 1 by mouth once daily  Patient Instructions: 1)  Please schedule a follow-up appointment in 6 months.   Orders Added: 1)  Est. Patient Level IV [44034] 2)  T- Capillary Blood Glucose [[id number]]   Immunization History:  Influenza Immunization History:    Influenza:  historical (06/13/2010)   Immunization History:  Influenza Immunization History:    Influenza:  Historical (06/13/2010)  Prevention & Chronic Care Immunizations   Influenza vaccine: Historical   (06/13/2010)   Influenza vaccine deferral: Deferred  (11/15/2009)    Tetanus booster: Not documented   Td booster deferral: Deferred  (07/24/2010)    Pneumococcal vaccine: Not documented   Pneumococcal vaccine deferral: Deferred  (07/24/2010)    H. zoster vaccine: Not documented   H. zoster vaccine deferral: Deferred  (07/24/2010)  Colorectal Screening   Hemoccult: Negative  (12/10/2005)    Colonoscopy: Location:  [facility].    (10/26/2008)   Colonoscopy action/deferral: Repeat colonoscopy in 1 year.     (09/17/2006)   Colonoscopy due: 11/2011  Other Screening   PSA: 1.93  (08/26/2006)   PSA action/deferral: Discussion deferred  (07/24/2010)   Smoking status: quit  (07/24/2010)  Lipids   Total Cholesterol: 193  (11/15/2009)   Lipid panel action/deferral: Lipid Panel ordered   LDL:  125  (11/15/2009)   LDL Direct: Not documented   HDL: 36  (11/15/2009)   Triglycerides: 161  (11/15/2009)    SGOT (AST): 12  (11/15/2009)   BMP action: Ordered   SGPT (ALT): 11  (11/15/2009)   Alkaline phosphatase: 53  (11/15/2009)   Total bilirubin: 0.5  (11/15/2009)    Lipid flowsheet reviewed?: Yes   Progress toward LDL goal: At goal  Self-Management Support :   Personal Goals (by the next clinic visit) :      Personal LDL goal: 100  (11/15/2009)    Patient will work on the following items until the next clinic visit to reach self-care goals:     Medications and monitoring: take my medicines every day, check my blood pressure, bring all of my medications to every visit, weigh myself weekly  (07/24/2010)     Eating: eat more vegetables, use fresh or frozen vegetables, eat baked foods instead of fried foods, eat fruit for snacks and desserts, limit or avoid alcohol  (07/24/2010)     Activity: take a 30 minute walk every day, take the stairs instead of the elevator, park at the far end of the parking lot  (07/24/2010)    Lipid self-management support: Written self-care  plan, Education handout, Resources for patients handout  (07/24/2010)   Lipid self-care plan printed.   Lipid education handout printed      Resource handout printed.

## 2010-09-14 NOTE — Consult Note (Signed)
Summary: [facility]: [facility]ol[facility]olgy Clinic  [facility]: Otolaryngolgy Clinic   Imported By: Florinda Marker 10/03/2009 14:50:15  _____________________________________________________________________  External Attachment:    Type:   Image     Comment:   External Document

## 2010-09-14 NOTE — Consult Note (Signed)
Summary: [facility]: Otolaryngology Visit  [facility]: Otolaryngology Visit   Imported By: Florinda Marker 08/18/2009 14:02:07  _____________________________________________________________________  External Attachment:    Type:   Image     Comment:   External Document

## 2010-09-14 NOTE — Assessment & Plan Note (Signed)
Summary: EST-CK/FU/MEDS/CFB   Vital Signs:  Patient profile:   77 year old male Height:      70 inches (177.80 cm) Weight:      191.3 pounds (86.95 kg) BMI:     27.55 Temp:     97.4 degrees F (36.33 degrees C) oral Pulse rate:   63 / minute BP sitting:   119 / 70  (left arm) Cuff size:   regular  Vitals Entered By: Cynda Familia Duncan Dull) (November 15, 2009 10:19 AM) CC: routine f/u and med refill Is Patient Diabetic? No Pain Assessment Patient in pain? no      Nutritional Status BMI of > 30 = obese  Have you ever been in a relationship where you felt threatened, hurt or afraid?Unable to ask   Does patient need assistance? Functional Status Self care Ambulation Normal   Primary Care Provider:  Rufina Falco MD   CC:  routine f/u and med refill.  History of Present Illness: PT is a 77 year old man with PMH significant for mandibular cancer, Dyslipidemia, iron deficiency anemia, CVA, seizure disorder, PAD, and Parkinson's disease who presents to clinic for f/u.  Patient has no new complaints to discuss, and is only requesting refills.   Patient denies SOB, CP, N,V,C,D, is otherwise doing well, and denies any other complaints.    Preventive Screening-Counseling & Management  Alcohol-Tobacco     Smoking Status: quit     Smoking Cessation Counseling: yes     Packs/Day: 1/4     Year Started: smoking for 50 yrs     Year Quit: 4/09 - jaw ca  Current Medications (verified): 1)  Keppra 1000 Mg Tabs (Levetiracetam) .... Take 1 Tablet By Mouth Two Times A Day 2)  Aggrenox 25-200 Mg Cp12 (Aspirin-Dipyridamole) .... Take 1 Tablet By Mouth Two Times A Day 3)  Requip 0.25 Mg Tabs (Ropinirole Hydrochloride) .... Take 1 Tablet By Mouth Three Times A Day 4)  Fe Tabs 325 (65 Fe) Mg Tbec (Ferrous Sulfate) .... Take 1 Tablet By Mouth Once A Day 5)  Pravachol 20 Mg Tabs (Pravastatin Sodium) .... Take 1 Tablet By Mouth Once A Day At Bedtime 6)  Flonase 50 Mcg/act Susp (Fluticasone  Propionate) .... 2 Sprays in Each Nostril One Time A Day 7)  Cvs Stool Softener 100 Mg Caps (Docusate Sodium) .Marland Kitchen.. 1 By Mouth Once Daily  Allergies (verified): No Known Drug Allergies  Review of Systems       per HPI  Physical Exam  General:  alert, well-developed, and cooperative to examination.    Head:  atraumatic.  jaw deformity again noted from ca, more on R.atraumatic.   Neck:  supple, full ROM, no thyromegaly, no JVD, and no carotid bruits.    Lungs:  normal respiratory effort, no accessory muscle use, normal breath sounds, no crackles, and no wheezes.  Heart:  normal rate, regular rhythm, no murmur, no gallop, and no rub.    Abdomen:  soft, non-tender, normal bowel sounds, no distention, no guarding, no rebound tenderness, no hepatomegaly, and no splenomegaly.    Msk:  no joint swelling, no joint warmth, and no redness over joints.    Extremities:  No cyanosis, clubbing, edema  Neurologic:  alert & oriented X3, cranial nerves II-XII intact, strength normal in all extremities, sensation intact to light touch, and gait normal.     Skin:   turgor normal and no rashes.  Psych:  Oriented X3, memory intact for recent and remote, normally interactive,  good eye contact, not anxious appearing, and not depressed appearing.    Impression & Recommendations:  Problem # 1:  DYSLIPIDEMIA (ICD-272.4) Assessment Comment Only  Patient has not taken statin for 1 year as he has ran out, however looking back his last LDL was 51 and he was on pravastatin 40, will reduce pravastatin to 20, and recheck FLP today. with plan to recheck in 6 months.   His updated medication list for this problem includes:    Pravachol 20 Mg Tabs (Pravastatin sodium) .Marland Kitchen... Take 1 tablet by mouth once a day at bedtime    His updated medication list for this problem includes:    Pravachol 20 Mg Tabs (Pravastatin sodium) .Marland Kitchen... Take 1 tablet by mouth once a day at bedtime  Orders: T-Lipid Profile  ([id number])  Problem # 2:  IRON DEFICIENCY ANEMIA, HX OF (ICD-V12.3) Assessment: Comment Only  on iron supplements, will recheck anemia panel to reassess need to iron pills.   Orders: T-Iron Binding Capacity (TIBC) ([id number]) Augusto Gamble [phone]) T-Ferritin [phone])  Problem # 3:  SEIZURE DISORDER (ICD-780.39) Assessment: Improved Well controlled, will continue to monitor.  His updated medication list for this problem includes:    Keppra 1000 Mg Tabs (Levetiracetam) .Marland Kitchen... Take 1 tablet by mouth two times a day  Problem # 4:  SMOKER (ICD-305.1) Assessment: Comment Only Patient was counseled on smoking cessation strategies including medications and behavior modification options.   Orders: T-Iron Binding Capacity (TIBC) ([id number]) T-Iron [id number]) T-Ferritin [phone])  Complete Medication List: 1)  Keppra 1000 Mg Tabs (Levetiracetam) .... Take 1 tablet by mouth two times a day 2)  Aggrenox 25-200 Mg Cp12 (Aspirin-dipyridamole) .... Take 1 tablet by mouth two times a day 3)  Requip 0.25 Mg Tabs (Ropinirole hydrochloride) .... Take 1 tablet by mouth three times a day 4)  Fe Tabs 325 (65 Fe) Mg Tbec (Ferrous sulfate) .... Take 1 tablet by mouth once a day 5)  Pravachol 20 Mg Tabs (Pravastatin sodium) .... Take 1 tablet by mouth once a day at bedtime 6)  Flonase 50 Mcg/act Susp (Fluticasone propionate) .... 2 sprays in each nostril one time a day 7)  Cvs Stool Softener 100 Mg Caps (Docusate sodium) .Marland Kitchen.. 1 by mouth once daily  Other Orders: T-Comprehensive Metabolic Panel ([id number])  Patient Instructions: 1)  Please schedule a follow-up appointment in 6 months. Prescriptions: PRAVACHOL 20 MG TABS (PRAVASTATIN SODIUM) Take 1 tablet by mouth once a day at bedtime  #30 x 6   Entered and Authorized by:   Darnelle Maffucci MD   Signed by:   Darnelle Maffucci MD on 11/15/2009   Method used:   Electronically to        [facility] [address] (retail)       [address]       [place], [place]  [id number]       Ph: [phone]       Fax: [phone]   RxID:   [id number]  Process Orders Check Orders Results:     [facility]: Check successful Tests Sent for requisitioning (November 15, 2009 12:22 PM):     11/15/2009: [facility] -- T-Lipid Profile [phone] (signed)     11/15/2009: [facility] -- T-Comprehensive Metabolic Panel [[id number]] (signed)     11/15/2009: [facility] -- T-Iron Binding Capacity (TIBC) [[id number]] (signed)     11/15/2009: [facility] -- Augusto Gamble [[id number]] (signed)     11/15/2009: [facility] -- T-Ferritin [  [id number]] (signed)    Prevention & Chronic Care Immunizations   Influenza vaccine: Not documented   Influenza vaccine deferral: Deferred  (11/15/2009)    Tetanus booster: Not documented    Pneumococcal vaccine: Not documented    H. zoster vaccine: Not documented  Colorectal Screening   Hemoccult: Negative  (12/10/2005)    Colonoscopy: Location:  [facility].    (10/26/2008)   Colonoscopy action/deferral: Repeat colonoscopy in 1 year.     (09/17/2006)   Colonoscopy due: 11/2011  Other Screening   PSA: 1.93  (08/26/2006)   Smoking status: quit  (11/15/2009)  Lipids   Total Cholesterol: 104  (11/01/2008)   Lipid panel action/deferral: Lipid Panel ordered   LDL: 51  (11/01/2008)   LDL Direct: Not documented   HDL: 32  (11/01/2008)   Triglycerides: 104  (11/01/2008)    SGOT (AST): 9  (09/16/2008)   BMP action: Ordered   SGPT (ALT): 10  (09/16/2008) CMP ordered    Alkaline phosphatase: 52  (09/16/2008)   Total bilirubin: 0.5  (09/16/2008)    Lipid flowsheet reviewed?: Yes   Progress toward LDL goal: Unchanged  Self-Management Support :   Personal Goals (by the next clinic visit) :      Personal LDL goal: 100  (11/15/2009)    Lipid self-management support: Not  documented      Process Orders Check Orders Results:     [facility]: Check successful Tests Sent for requisitioning (November 15, 2009 12:22 PM):     11/15/2009: [facility] -- T-Lipid Profile [phone] (signed)     11/15/2009: [facility] -- T-Comprehensive Metabolic Panel [[id number]] (signed)     11/15/2009: [facility] -- T-Iron Binding Capacity (TIBC) [[id number]] (signed)     11/15/2009: [facility] -- Augusto Gamble [[id number]] (signed)     11/15/2009: [facility] -- T-Ferritin [[id number]] (signed)

## 2010-09-14 NOTE — Progress Notes (Signed)
Summary: refill/ hla  Phone Note Refill Request Message from:  Patient on May 09, 2010 4:31 PM  Refills Requested: Medication #1:  AGGRENOX 25-200 MG CP12 Take 1 tablet by mouth two times a day   Dosage confirmed as above?Dosage Confirmed last visit 11/2009  Initial call taken by: Marin Roberts RN,  May 09, 2010 4:31 PM    Prescriptions: AGGRENOX 25-200 MG CP12 (ASPIRIN-DIPYRIDAMOLE) Take 1 tablet by mouth two times a day  #62 Capsule x 6   Entered and Authorized by:   Zoila Shutter MD   Signed by:   Zoila Shutter MD on 05/09/2010   Method used:   Electronically to        [facility] (retail)       [address]       [place], [place]  [id number]       Ph: [phone]       Fax: [phone]   RxID:   [id number]

## 2010-10-24 LAB — GLUCOSE, CAPILLARY: Glucose-Capillary: 99 mg/dL (ref 70–99)

## 2010-10-26 ENCOUNTER — Encounter: Payer: Self-pay | Admitting: Internal Medicine

## 2010-11-16 ENCOUNTER — Ambulatory Visit: Payer: Medicare Other | Attending: Radiation Oncology | Admitting: Radiation Oncology

## 2010-11-23 LAB — DIFFERENTIAL
Basophils Absolute: 0 10*3/uL (ref 0.0–0.1)
Eosinophils Absolute: 0 10*3/uL (ref 0.0–0.7)
Eosinophils Relative: 0 % (ref 0–5)
Lymphocytes Relative: 17 % (ref 12–46)
Monocytes Absolute: 0.6 10*3/uL (ref 0.1–1.0)

## 2010-11-23 LAB — CBC
Platelets: 249 10*3/uL (ref 150–400)
RBC: 4.82 MIL/uL (ref 4.22–5.81)
WBC: 4.8 10*3/uL (ref 4.0–10.5)

## 2010-11-23 LAB — COMPREHENSIVE METABOLIC PANEL
ALT: 12 U/L (ref 0–53)
AST: 20 U/L (ref 0–37)
Albumin: 4.2 g/dL (ref 3.5–5.2)
Alkaline Phosphatase: 54 U/L (ref 39–117)
Chloride: 92 mEq/L — ABNORMAL LOW (ref 96–112)
GFR calc Af Amer: 47 mL/min — ABNORMAL LOW (ref >60)
Potassium: 3.6 mEq/L (ref 3.5–5.1)
Sodium: 136 mEq/L (ref 135–145)
Total Bilirubin: 0.9 mg/dL (ref 0.3–1.2)

## 2010-12-26 NOTE — Assessment & Plan Note (Signed)
OFFICE VISIT   Chase Berger, Chase Berger  DOB:  06/25/1933                                       08/21/2007  [id number]#:[id number]   The patient returns today for evaluation of right lower extremity  claudication.  My notes indicate that he was seen back in April 2007  with similar complaints.  Right calf claudication.  At that time, this  was felt not to be significantly lifestyle limiting, and no intervention  was undertaken.  Ankle brachial index at that time were 1.0 on the left  and 0.69 on the right.   The patient returns at this time with continued complaints of right  lower extremity claudication.  This is calf claudication which occurs at  approximately 2 blocks.  He has no night pain or rest pain.   PAST MEDICAL HISTORY:  Includes peripheral vascular disease, CVA,  seizure disorder, Parkinson's disease, and tobacco abuse.   CURRENT MEDICATIONS:  Include Aggrenox 200/25 b.i.d., Keppra 1000 mg  b.i.d., and Requip 0.25 mg t.i.d.   No allergies known.   The patient is a 77 year old African-American gentleman who is alert and  oriented.  In no acute distress.  BP 126/73, pulse 84 per minute and  regular, respirations 16 per minute.  His lower extremities reveal  intact femoral pulses bilaterally.  No popliteal, posterior tibial, or  dorsalis pedis pulses.  No ischemic skin changes.  1+ edema.   The patient has quite stable lower extremity claudication.  This does  not require intervention at present.  No significant lifestyle  limitation associated with this.  No night pain or respiratory pain.  The limb is not at risk.  Follow up on a p.r.n. basis.   Balinda Quails, M.D.  Electronically Signed   PGH/MEDQ  D:  08/21/2007  T:  08/22/2007  Job:  [id number]   cc:   Manning Charity, MD

## 2010-12-26 NOTE — Consult Note (Signed)
[name], [name] NO.:  [id number]   MEDICAL RECORD NO.:  [id number]          PATIENT TYPE:  REC   LOCATION:  RDNC                         FACILITY:  [facility]   PHYSICIAN:  Charlynne Pander, D.D.S.DATE OF BIRTH:  [birth year]   DATE OF CONSULTATION:  01/13/2008  DATE OF DISCHARGE:                                 CONSULTATION   Chase Berger is a 77 year old male referred by Dr. [name]  for dental consultation.  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  The patient underwent a  left segmental mandibulectomy with Dr. Hezzie Bump at [facility].  The patient was then referred for  postoperative radiation therapy with Dr. Lestine Box.  The patient is now  seen as part of pre-radiation therapy dental protocol evaluation.   HISTORY OF PRESENT ILLNESS:  1. 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  A.)  Initial biopsy by Dr. [name] in February 2009.  B.)  The patient subsequently referred to Dr.  Hezzie Bump who performed a  left segmental mandibulectomy with tracheostomy, left neck dissection,  left radical parotidectomy, and mucocutaneous flap reconstruction.  This  was performed on November 14, 2007.  C.)  Anticipated postoperative radiation therapy with Dr. Lestine Box.  1. Peripheral vascular disease.  2. History of stroke approximately 5-7 years ago.  With some residual      right-sided weakness.  3. History of seizure disorder, currently Keppra.  4. History Parkinson's disease, currently on Requip.  5. History of seasonal allergies.  6. Status post colonoscopy in 2008 with several polyps which were      removed.  These were noncancerous.   ALLERGIES:  None known.   MEDICATIONS:  1. Artificial tears to left eye as needed.  2. Vicodin 5/500 one every 4 to 6 hours as needed for pain.  3. Keppra 500 mg twice daily.  4. Requip 1 mg three times daily.  5. Zantac 15 mg per 5  mL four teaspoonfuls via tube twice daily.  6. Feosol 325 mg daily.  7. Colace 100 mg daily.  8. Erythromycin eye ointment placed at bedtime to the left eye.  9. Aggrenox twice daily.   SOCIAL HISTORY:  The patient is married.  The patient is retired.  Patient with a history of smoking five to six cigarettes per day for the  past 40 years.  The patient also with a history of alcohol use, but quit  approximately eight years ago.   FAMILY HISTORY:  Mother is deceased with a history of end-stage renal  disease on hemodialysis.  Father died from a heart attack.   FUNCTIONAL ASSESSMENT:  The patient remains independent for basic ADLs,  but does need assistance with instrumental ADLs.   REVIEW OF SYSTEMS:  This is reviewed with the patient and family and is  included in dental consultation record today.   DENTAL HISTORY.:  Chief complaint:  The patient referred for a pre-  radiation therapy dental evaluation.   HISTORY OF PRESENT ILLNESS:  The patient was last seen  by dentist Dr.  Memory Dance in December 2008.  The patient subsequently referred  for dental extraction.  The patient saw Dr. [name] in February  2009 when he performed extraction tooth number 17 and 18 and sent a  biopsy off to [name].  This was noted to have some  sarcoma like aspects and the patient was subsequently referred to Dr.  [name] at [facility].  The  patient then had surgical resection of the lesion and was referred to  Dr. Lestine Box for postoperative radiation therapy.  The patient is now  seen as part of a pre-radiation therapy dental evaluation.   The patient currently denies acute toothache, swellings or abscesses.  The patient has a maxillary acrylic partial denture which he indicates  fits pretty good..   DENTAL EXAM:  GENERAL:  The patient is well-developed, well-nourished  male in no acute distress.  VITAL SIGNS:  Blood pressure  126/74, pulse rate 64, temperature 98.3.  EXTRAORAL: The left neck and face are consistent with previous surgical  resection of the left mandible with mucocutaneous flap reconstruction.  Left facial nerve damage is noted along with involvement of the left eye  as well.  INTRAORAL EXAM: Patient with normal to excess saliva.  The left side of  the mouth is consistent with a mucocutaneous flap reconstruction.  There  is no evidence of abscess formation at this time.  DENTITION:  The patient is missing multiple teeth numbers 2, 4, 6, 7, 8,  12, 13, 15, 17, 18, 19, 20 and 30.  The patient is missing the left  condyle, coronoid process, and body of mandible up to tooth #21.  PERIODONTAL: Patient with chronic periodontitis with plaque calculus  accumulations, generalized gingival recession, and generalized tooth  mobility.  DENTAL CARIES:  There are multiple dental caries as per dental charting  form.  ENDODONTIC: The patient currently denies acute pulpitis symptoms.  There  is no obvious peri-apical pathology noted at this time.  CROWN OR BRIDGE:  There are no crown or bridge restorations.  PROSTHODONTIC:  The patient has a maxillary acrylic partial denture  which is ill-fitting.  The patient denies presence of a partial denture.  OCCLUSION: Significant malocclusion secondary to the loss of the left  condyle and body of mandible consistent with a previous left segmental  mandibulectomy.  Patient with a significant malocclusion at this time  consist with previous surgery.   RADIOGRAPHIC INTERPRETATION:  A supoptimal panoramic x-ray was obtained.  There are multiple missing teeth.  There is evidence of missing left  condyle and mandible up to tooth #20.  There are moderate to severe bone  loss.  There is a malocclusion noted.  There are dental caries noted.   ASSESSMENT:  1. Plaque and calculus accumulations.  2. Chronic periodontitis with bone loss.  3. Tooth mobility.  4. Generalized  gingival recession.  5. Multiple missing teeth.  6. Missing left Condyle, coronoid process, and body of mandible up to      tooth #21.  7. A significant malocclusion.  8. Dental caries.  9. Ill-fitting maxillary partial denture.   PLAN/RECOMMENDATIONS:  1. I discussed risks, benefits, complications  and various treatment      options with the patient and family with relationship to his      medical and dental conditions and anticipated radiation therapy and      radiation therapy side effects to include xerostomia, radiation  caries, trismus, mucositis, taste changes, gum and jaw bone      changes, risk for infection and osteoradionecrosis.  We discussed      various treatment options to include no treatment, total and      subtotal extraction with alveoloplasty, periodontal therapy, dental      restorations, crown or bridge therapy, root cancal therapy, implant      therapy, and replacing the missing teeth as indicated.  The patient      and family are currently interested in extraction of all remaining      teeth with alveoloplasty and pre-prosthetic surgery as indicated.      I will need to determine if I am able to refer the patient to an      oral surgeon or if possible tertiary care referral might be more      prudent.  I intend on discussing this further with Dr. [name]      and will consider referral as indicated. Pateint is aware that      overall prosthodontic rehabilitation will ideally need to be      performed by an Oral and Maxillofacial Prosthodontist.   1. Discussion of findings with Dr. Lestine Box, Dr. Hezzie Bump and Dr. Monia Pouch      as indicated.      Charlynne Pander, D.D.S.  Electronically Signed     RFK/MEDQ  D:  01/13/2008  T:  01/13/2008  Job:  [id number]   cc:   Durene Romans. Lestine Box, MD  Fax: [phone]   Dora Sims, M.D.  Fax: [phone]

## 2010-12-26 NOTE — Assessment & Plan Note (Signed)
[facility]                                 ON-CALL NOTE   AVAN, GULLETT                        MRN:          [id number]  DATE:10/27/2008                            DOB:          21-Oct-1932    Mr. Krikorian's wife called and said that he had had some vomiting tonight  and vomited black material.  He had a normal brown bowel movement.  He  denies dizziness.  I advised Ms. Gagan to go to the hospital if her  husband develops black, maroon, or reddish stools.  If the vomiting  continues, he was instructed to contact his primary care physician.     Barbette Hair. Arlyce Dice, MD,FACG  Electronically Signed    RDK/MedQ  DD: 10/27/2008  DT: 10/28/2008  Job #: [id number]

## 2010-12-29 NOTE — Consult Note (Signed)
Chase Berger, Chase Berger               ACCOUNT NO.:  [id number]   MEDICAL RECORD NO.:  [id number]          PATIENT TYPE:  EMS   LOCATION:  MAJO                         FACILITY:  [facility]   PHYSICIAN:  Jefry H. Pollyann Kennedy, MD     DATE OF BIRTH:  [birth year]   DATE OF CONSULTATION:  07/29/2005  DATE OF DISCHARGE:  07/29/2005                                   CONSULTATION   DATE OF SERVICE:  July 29, 2005.   DIAGNOSIS:  Intractable epistaxis.   HISTORY:  This is a 77 year old gentleman who has been to the emergency room  on three occasions over the past couple of days for severe right-sided  anterior epistaxis. It started Wednesday evening and has been bleeding on a  recurrent nature. He has a history of aspirin and Plavix use.   PRIMARY CARE PHYSICIAN:  Dr. Wyonia Hough   No other history of nasal problems.   PHYSICAL EXAMINATION:  Healthy-appearing elderly gentleman with blood  staining and dried blood around his face and his upper torso. There is a  large amount of clotted blood within the right nasal cavity that was  carefully suctioned out. Topical Neo-Synephrine was applied. Neo-Synephrine  was then applied with pledgets as well. Careful nasal inspection. There was  a significant septal deviation with a concavity on the right side. There is  some cautery artifact along the mid-portion of the septum. Just posterior to  the cautery artifact is the tiny ulceration with bleeding site that was  identified. This area was anesthetized using 1% Xylocaine with epinephrine.  The area was then extensively cauterized with silver nitrate. After  evacuating the remainder of the old blood and clots from both nasal  cavities, reinspection revealed no evidence of further bleeding. Provocative  measures were taken using the [facility] tip suction on this area and still I  was not able to cause any further bleeding.   IMPRESSION:  Severe intractable epistaxis, treated with silver nitrate  cautery. Recommend he  stop his Plavix and aspirin and contact Dr. Wyonia Hough  during the week to discuss with him about starting them up again in a few  days if there is no further bleeding.  Recommend frequent use of nasal  saline spray 10-20 times per day. Contact me directly if there is any  additional bleeding.      Jefry H. Pollyann Kennedy, MD  Electronically Signed     JHR/MEDQ  D:  07/30/2005  T:  07/30/2005  Job:  [id number]

## 2010-12-29 NOTE — Op Note (Signed)
Chase Berger, Chase Berger               ACCOUNT NO.:  [id number]   MEDICAL RECORD NO.:  [id number]          PATIENT TYPE:  INP   LOCATION:  X003                         FACILITY:  [facility]   PHYSICIAN:  Anselm Pancoast. Weatherly, M.D.DATE OF BIRTH:  1932-09-08   DATE OF PROCEDURE:  11/05/2006  DATE OF DISCHARGE:                               OPERATIVE REPORT   PREOPERATIVE DIAGNOSIS:  Lesion, hepatic flexure, colon, probably a  tubulovillous adenoma, possibly gallstones.   POSTOPERATIVE DIAGNOSIS:  Large 5 cm polyp, hopefully a tubulovillous  adenoma, hepatic flexure of colon.   OPERATION:  Right colectomy.   General anesthesia.   SURGEON:  Anselm Pancoast. Zachery Dakins, M.D.   ASSESSMENT:  Chase Berger, M.D.   HISTORY:  Chase Berger is a 77 year old male.  Dr. Frederico Hamman is his regular  medical doctor.  Recent colonoscopy by Dr. Arlyce Dice revealed the findings  of a large lesion in the hepatic flexure of the colon that was thought  probably to be a cancer.  The pathology report, however, showed a  tubulovillous adenoma with dysplasia.  He has been evaluated with CTs  and no other abnormalities were noted.  There were a couple of small  polyps in the left colon that were removed.  The patient weighs about  221 pounds, has a history of seizure disorder, for which he is on  medication and has been, I think, some history of some length of time  since his last seizure.  He also has a diagnosis of early Parkinson  disease.   The patient had an erythromycin, neomycin and GoLYTELY bowel prep in  preparation for the surgery.  He stated he got good results and actually  had his last bowel movement this morning about 5 a.m.  Preoperatively an  IV was started.  He was given 3 g of Unasyn.  He had PAS stockings and  was taken back.  He had induction of general anesthesia by Dr. Council Mechanic.  The abdomen after a Foley catheter was inserted sterilely was prepped  with Betadine solution and draped in a sterile  manner.  I elected to do  a transverse right incision, I think it will be less painful  postoperatively, and the skin, subcutaneous tissue and all was divided  with a transverse incision that would go about 2 fingerbreadths above  the umbilicus, and then the underlying rectus fascia was divided  transversely with the cautery.  Hemostasis was obtained.  A couple of  vessels required suturing with 2-0 Vicryl and then the anterior rectus,  right rectus, was divided with cautery.  Numerous little bleeders were  clamped and coagulated.  The posterior rectus fascia and peritoneum was  picked up and entered carefully into the peritoneal cavity.  The  patient's small bowel was a little gaseous but we were able to pack it  away.  At first on trying to palpate the lesion, I could not actually  feel it and there was no indentation of the colon.  We kind of elevated  the omentum off of it and then could identify the lesion probably about  2 inches  proximal to the hepatic flexure.  This is where Dr. Arlyce Dice had  described it.  We then started mobilizing the cecum, divided it lateral  peritoneal reflection, several little areas were tied with 2-0 silk and  cautery was used predominantly, and then the hepatic flexure could be  mobilized and brought into the field.  We placed a Thompson retractor on  the right side for exposure since he is heavy enough that it was a  pretty good struggle, and then we could bring the hepatic flexure into  the field.  The patient still has his appendix, and this was mobilized  and the cecum was brought into the incision.  I picked an area distally  on the ileum about 4 inches from the ileocecal valve where we would do  the anastomosis and then divided the mesentery with clamps and sutures  of 2-0 silk and then the major vessels between the right colon were  divided between [facility].  The larger vessels were doubly ligated with 2-0  silk and then we took the transverse colon  over to probably about 2  inches to the right side of the middle colic artery.  At this level  would be a good place for the anastomosis.  Hemostasis was obtained and  then we did the anastomosis with a GIA-55 and a TA-60 in kind of a  triangulation manner and then removed the specimen from the field.  Dr.  Karin Golden looked at it and said that the lesion was as described.  He did  not do a frozen and, hopefully, this is still just a tubulovillous  adenoma and not really a frank cancer.  There were a couple of other  small polyps in the specimen and the little mesenteric defect was closed  with interrupted sutures of 3-0 silk.  The small bowel was in anatomical  position.  The omentum was brought down over it.  I did remove a small  segment of the right side of the omentum.  There was still an abundance  of omentum.  Then, the CT had kind of questioned whether he had a small  gallstone.  We could not feel anything in the gallbladder and there was  no evidence of any inflammation, so we did not remove his gallbladder.  The peritoneum and posterior rectus fascia was closed with an 0 Vicryl,  the anterior rectus fascia at the midline was closed with #1 Novofil.  The right side was closed with interrupted sutures of 0 Prolene.  The  wound was thoroughly irrigated between layers and the skin was closed  with staples.  We had an NG tube in and we had an oral tube, and we took  it out prior to awakening and we will keep him n.p.o., then we will use  PCA morphine for postoperative pain control.  We will see if we can give  him his seizure medications with a sip of water, and we will have the  pharmacist make recommendations if they think the IV seizure medications  are needed.           ______________________________  Anselm Pancoast. Zachery Dakins, M.D.     WJW/MEDQ  D:  11/05/2006  T:  11/05/2006  Job:  [id number]   cc:   Barbette Hair. Arlyce Dice, MD,FACG [address]  [place]  [place] [address]   Vanetta Mulders, MD  Fax: [phone]

## 2010-12-29 NOTE — Letter (Signed)
September 09, 2006    Landis Gandy   RE:  SID, GREENER  MRN:  [id number]  /  DOB:  Mar 07, 1933   Dear Mr. Morken:   It is my pleasure to have treated you recently as a new patient in my  office.  I appreciate your confidence and the opportunity to participate  in your care.   Since I do have a busy inpatient endoscopy schedule and office schedule,  my office hours vary weekly.  I am, however, available for emergency  calls every day through my office.  If I cannot promptly meet an urgent  office appointment, another one of our gastroenterologists will be able  to assist you.   My well-trained staff are prepared to help you at all times.  For  emergencies after office hours, a physician from our gastroenterology  section is always available through my 24-hour answering service.   While you are under my care, I encourage discussion of your questions  and concerns, and I will be happy to return your calls as soon as I am  available.   Once again, I welcome you as a new patient and I look forward to a happy  and healthy relationship.    Sincerely,      Barbette Hair. Arlyce Dice, MD,FACG  Electronically Signed   RDK/MedQ  DD: 09/09/2006  DT: 09/09/2006  Job #: [id number]

## 2010-12-29 NOTE — Discharge Summary (Signed)
NAMECASHIS, RILL               ACCOUNT NO.:  [id number]   MEDICAL RECORD NO.:  [id number]          PATIENT TYPE:  INP   LOCATION:  1614                         FACILITY:  [facility]   PHYSICIAN:  Anselm Pancoast. Weatherly, M.D.DATE OF BIRTH:  05-Jun-1933   DATE OF ADMISSION:  11/05/2006  DATE OF DISCHARGE:  11/21/2006                               DISCHARGE SUMMARY   DISCHARGE DIAGNOSIS:  1. Tubulovillous adenoma right colon.  2. History of Parkinson's disease.  3. History of previous cerebrovascular accident.  4. Marked postop ileus probably secondary to some of his      antiparkinson's medications.   HISTORY:  Zahir Eisenhour is a 77 year old the black male with a history  of' seizures; and has also had a stroke, but is able to essentially care  for himself.  He had a colonoscopy by Dr. Arlyce Dice with the findings of  numerous adenomatous polyps, but a lesion in the hepatic flexure area  was much larger and surgery was recommended.  The patient is followed by  the [facility] neurology for his Parkinson's and seizure problems; and  he is ambulatory.  He underwent a GoLYTELY and erythromycin bowel prep  in preparation for the surgery; and I am planning on doing him through a  right transverse incision; as, I think, he will have last pain.  He was  admitted on the morning of surgery, Dr. Harriette Bouillon assisted with the  surgery and the right colectomy was stapled; and anastomosis was  performed.  The lesion was benign.  It did measure about 3 cm in size;  and there was some foci of minimal dysplasia but no frank carcinoma  identified.  Postoperatively, I had hoped to be able to treat him  without an NG tube.  I did not start him on a diet, but he was kind of  slow and opening up, and became distended.  His immediate postoperative  hematocrit was 37 with a white count of 10,200; and we had given him his  chronic seizure medications which are Zeppra I think that he was getting  1 gram b.i.d.  orally; and he appeared to be doing satisfactory, but was  more bloated, over the first few days and an NG tube was placed on the  third postoperative day of maybe fourth when the KUB showed that his  stomach was significantly distended; and he felt better afterwards.  His  electrolytes and white count were essentially unremarkable. His incision  appeared to be healing nicely; and he was passing some flatus, but there  was a moderate to large amount of NG drainage; and we continued with the  NG suction.  It was noted that the drainage was less bilious, but still  fairly large quantities; and we were watching how much fluids he was  taking orally etcetera. I continued when we placed the NG tube to get  his Zeppra with clamping the NG tube.  I had talked with the  neurologist, on call for the agrees with neurology on the 2nd, which was  about a week after his surgery; and they said they were  not aware of it  causing problems with ileus, but he could be given intravenously and  since he had not had any seizures for several years. could decrease the  dose to 500 mg IV q.12 h. which we.   He really started having significant improvement of bowel function and  decreased NG drainage immediately with doing this; and, I think, it was  not just the timing, but that the medicine was contributing to his  ileus.  His family stated that ever since he had started taking that  medication he had had a significant problem with constipation etcetera.  His bowels started working; we were able to remove the nasogastric tube  approximately 3 days later; and he continued to improve.  His white  count was normal; and he was ready for discharge in improved condition  on 11/21/2006.  I am going to decrease the Keppra to 500 mg p.o. b.i.d.  where he has been on 1000 b.i.d. and also milk  of magnesia.  is other chronic medications are Requip, the same as  preoperatively, and Darvocet N 100 for pain.  He will see [place]  for a wound  check in approximately 2 weeks.  His incision has healed nicely and  staples have been removed, and Steri-Stripped prior to his discharge.           ______________________________  Anselm Pancoast. Zachery Dakins, M.D.     WJW/MEDQ  D:  12/08/2006  T:  12/08/2006  Job:  [id number]   cc:   Barbette Hair. Arlyce Dice, MD,FACG  [address]  [place]  [place] [address]
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
## 2010-12-29 NOTE — Consult Note (Signed)
[place]. [facility]  Patient:    Chase Berger, Chase Berger                      MRN: [id number] Proc. Date: 07/15/00 Adm. Date:  [id number] Attending:  Farley Ly CC:         [facility], [address]   Consultation Report  HISTORY OF PRESENT ILLNESS:  Chase Berger is a 77 year old right-handed black gentleman born 1933/05/12, with a history of seizures in the past.  The patient has a history of prior head trauma with subsequent left temporal encephalomalacia, seizure secondary to this.  The patient also has a history of alcoholism in the past.  The patient apparently has been treated with Depakote and Dilantin but has had a history of worsening gait disturbance over the last week.   The patient apparently got to the point where he was unable to ambulate at all for over a week and then was brought to the hospital at this point for further evaluation.  Dilantin level was found to be 24, and this has been held.  Levels dropped to around 20 at this point.  The patient has a borderline low-normal albumin level of 3.6.  This patient, however, has history of a tremor that involved the right upper extremity more so than the left.  Actual history of the tremor is not clear, but this patient has had some tremor at least since the beginning of January 2001.  The patient clearly has tremor bilaterally, worse however, on the right side than the left.  The patient has not been diagnosed with Parkinsons disease per se.  The patient denies a family history of tremor.  Neurology is consulted at this point for evaluation of the tremor, gait disturbance, rule out Parkinsons disease.  PAST MEDICAL HISTORY:  Significant for: 1. History of tremor, right greater than left, rule out Parkinsons disease. 2. History of aphasia associated with left temporal encephalomalacia. 3. History of alcoholism. 4. History of seizures. 5. History of atrial  fibrillation. 6. History of hypertension. 7. History of right upper extremity tremor, as above. 8. History of Dilantin toxicity on this admission. 9. Tobacco abuse.  CURRENT MEDICATIONS: 1. Aspirin 325 mg daily. 2. Protonix 40 mg daily. 3. Dilantin 100 mg q.h.s. 200 mg in the morning. 4. Ativan 2 mg if needed.  This patient has no known allergies and has not had anything to drink since November 2000.  He does have history of smoking.  SOCIAL HISTORY:  The patient is married, lives in the [place] area, is not employed, has six children who are alive and well.  FAMILY MEDICAL HISTORY:  Notable that both parents passed away.  Mother died on dialysis.  History of myocardial infarction.  Father has history of coronary artery disease.  The patient has 12 brothers and sisters.  Two died from complications of alcoholism.  REVIEW OF SYSTEMS:  Notable for no recent fever or chills.  The patient denies headache, denies visual field changes, denies problems swallowing, denies breathing problems, chest pains, nausea or vomiting, abdominal pain.  The patient denies any new numbness or weakness on the face, arms, or legs.  There is a questionable history of right-sided weakness prior to this admission, however.  The patient denies any dizziness or recent blackouts.  PHYSICAL EXAMINATION:  VITAL SIGNS:  Blood pressure is 130/82, heart rate 72, respiratory rate 20, temperature afebrile.  GENERAL:  The patient is  a fairly well developed black male who is alert and cooperative at the time of examination.  HEENT:  Head is atraumatic.  Eyes - pupils, equal, round, reactive to light. Discs flat bilaterally.  NECK:  Supple, no carotid bruits or nodes.  RESPIRATORY:  Clear to auscultation and percussion.  CARDIOVASCULAR:  Regular rate and rhythm.  No obvious murmurs or rubs noted.  EXTREMITIES:  Without significant edema.  NEUROLOGIC:  Cranial nerves as above.  Facial symmetry appears to  be present. The patient has some decreased sensation on the right face compared to the left.  The patient has full visual fields to double simultaneous stimulation. Speech is again fairly well enunciated.  Motor testing reveals 5/5 strength on all fours.  Good symmetric mobility is noted throughout.  Right upper extremity drift, however, is present.  No tremor is prominent bilaterally in the arms, more so on the right than the left.  Both sides involve pill-rolling type tremor.  No significant cogwheel rigidity is noted.  The patient has some slightly depressed but symmetric reflexes.  Toes are neutral bilaterally.  The patient has fair finger-nose-finger, toe-to-finger bilaterally.  The patient is able to ambulate, wide-based gait, does not truly shuffle the feet and stands erect.  The patient may have some oral facial lingual apraxia.  Romberg is unsteady, but patient does not fall.  Pinprick, soft touch, vibratory sensation is slightly depressed on the right arm and leg as compared to the left.  LABORATORY VALUES:  Notable for Dilantin level of 19.4, white count of 11.5, hemoglobin 14.4, hematocrit 41.0, MCV 94.3, platelets 310, coagulation studies unremarkable, sodium 142, potassium 3.8, chloride 96, CO2 33, glucose 76, BUN 6, creatinine .7, calcium 9.1, total protein 6.7, albumin 3.6, AST 32, ALT 19, ALP 79, total bilirubin .3, TSH 2.8, B12 319, RPR is nonreactive.  MRI scan of the brain shows no acute infarcts.  IMPRESSION: 1. History of gait instability with Dilantin toxicity on this admission. 2. History of tremor, right greater than left upper extremity, rule out some    Parkinsons features. 3. History of alcohol abuse. 4. History of seizures.  This patient clearly does have pill-rolling type tremor of both upper extremities.  We will consider the possibility of Parkinsons disease.  Need to review the full MRI report and consider if initiation of medication such as Requip  to see if some of the gait problems and tremor can be suppressed.  Certainly, benign central tremors may look very similarly to Parkinsons type tremors and cannot completely exclude this, however.  PLAN: 1. Dilantin level needs to be followed closely.  Certainly the acute change in    gait over the last week likely secondary to Dilantin toxicity. 2. Addition of Requip 0.25 mg tablet taking one 3 times a day for a week, then    go up to 0.5 mg 3 times a day. 3. Follow up with [facility] in the next 4-6 weeks.  Will    follow this patient closely while in-house. DD:  07/15/00 TD:  07/15/00 Job: [id number]

## 2010-12-29 NOTE — Op Note (Signed)
[place]. [facility]  Patient:    Chase Berger, Chase Berger Visit Number: [id number] MRN: [id number]          Service Type: OBV Location: 5000 [id number] Attending Physician:  Burnard Bunting Dictated by:   Cammy Copa, M.D. Proc. Date: 11/02/01 Admit Date:  11/01/2001 Discharge Date: 11/02/2001                             Operative Report  PREOPERATIVE DIAGNOSIS:  Left wrist distal radius fracture.  POSTOPERATIVE DIAGNOSIS:  Left wrist distal radius fracture.  PROCEDURE:  Left wrist closed reduction with percutaneous pinning.  SURGEON:  Cammy Copa, M.D.  ANESTHESIA:  General endotracheal.  ESTIMATED BLOOD LOSS:  2 cc.  DRAINS:  None.  PROCEDURE IN DETAIL:  The patient was brought to the operating room where general endotracheal anesthesia was induced.  Preoperative IV antibiotics were administered.  Left wrist and arm was prepped with DuraPrep solution.  Closed reduction was performed and under fluoroscopic guidance, an anatomic reduction was achieved in both the AP and lateral planes.  A small incision was made through the skin over the radial styloid.  Soft tissue was spread bluntly down to the tip of the radial styloid.  Then .62 K-wire was placed through two cortices.  The second .62 K-wire was placed in a similar fashion.  Reduction was confirmed in the AP and lateral planes under fluoroscopy.  Releasing incisions were made distal to the pin entry points to relieve skin tension. Incisions were closed proximally with 3-0 nylon suture.  Bactroban and Xeroform were placed over the pin sites.  The pins were bent and capped. Bulky hand splint was placed.  The patient was transferred to the recovery room in stable condition. Dictated by:   Cammy Copa, M.D. Attending Physician:  Burnard Bunting DD:  11/02/01 TD:  11/03/01 Job: [id number]

## 2010-12-29 NOTE — Consult Note (Signed)
[place]. [facility]  Patient:    Chase Berger                       MRN: [id number] Proc. Date: 06/28/99 Adm. Date:  [id number] Attending:  Farley Ly                          Consultation Report  CHIEF COMPLAINT:  Seizures, aphasia.  HISTORY OF PRESENT ILLNESS:  [name] is a 77 year old, presumed right-handed,  black male with a history of alcohol abuse, hypertension, and seizures presumably secondary to the alcohol abuse.  There is no clear history of prior atrial fibrillation.  He was admitted three days ago with confusion according to the wife, and then had a generalized tonoclonic seizure in the emergency room, requiring intubation.  The patient was also noted to be in atrial fibrillation at presentation, which I gather is new.  This at least was not mentioned in ER visit, October 17, 1998.  He is not on any medication for atrial fibrillation or seizures, for that matter.  Since extubation he has remained confused.  An EEG was done yesterday showing a left hemispheric dysrhythmic activity, and subsequent evaluations have revealed that the confusion probably really is aphasia.  A CT can on admission was negative for any acute findings revealing only atrophy and some right anterolateral temporal lobic encephalomalacia which is felt to be due to trauma or infarct, but was unchanged from October 17, 1998 and at that time was unchanged from prior CTs.  He presented to the emergency room in March of this ear with a seizure with a previous history of seizure disorder, presumed alcohol related.  The patient was on no seizure medication.  He was given a partial load of 500 mg of Dilantin and discharged from the emergency room on Dilantin 300 q.h.s., but it does not appear that he ever took it.  He never followed up as far as anyone knows.  The patient himself could not further assist with the history.  REVIEW OF SYSTEMS:  Unobtainable.  PAST  MEDICAL HISTORY:  Significant for hypertension, alcohol abuse, hyperglycemia, seizures presumed secondary to alcohol abuse.  All of this was obtained from the old charts.  No mention of atrial fibrillation prior to this admission that I have available.  MEDICATIONS:  None.  He was started on Dilantin 300 q.h.s. in March, but it is unclear if he had been compliant.  ALLERGIES:  No known drug allergies.  SOCIAL HISTORY:  He is married.  Lives with his wife.  He does smoke cigarettes and use alcohol and has a history of alcohol abuse.  He is unemployed.  FAMILY HISTORY:  Unobtainable.  PHYSICAL EXAMINATION:  VITAL SIGNS:  Temperature 97.8, pulse 78, BP 145/68, respirations 22.  He was in atrial fibrillation in the emergency room and was converted with Cardizem to normal sinus rhythm.  HEENT:  The head is normocephalic, atraumatic.  NECK:  Supple without bruits.  HEART:  Regular rate and rhythm now.  LUNGS:  Clear to auscultation.  EXTREMITIES:  Mild edema.  NEUROLOGIC:  Mental status:  He is awake and alert.  He has occasional spontaneous speech but largely incoherent.  He will say "Yah," "okay" and "uh huh." Occasionally he will spit out a sentence very quickly that I cannot quite catch and I cannot get him to repeat himself on command.  He is not following any  verbal commands unequivocally for me.  Will perform some automatic movements.  If a spoon full of food is put in front of his face, he will open his mouth, e.t., and eat  without difficulty, but he will not follow strictly verbal commands.  He will, however, repeat sentences on command and once I have asked him to do that he then repeated everything I said for the next minute or two, even when I asked him not to anymore.  I cannot otherwise evaluate his mental status.  Cranial nerves:  His pupils are small but reactive.  Seems to track objects in ll visual fields.  Full extraocular movements to ocular  cephalic maneuvers, but will not follow commands.  There is no clear facial asymmetry.  No clear dysarthria.   Motor exam:  There is not much spontaneous movement and he will not move to command, but he seems to have a mild relative right hemiparesis of his upper extremities when I attempt to check for drift, e.g., he will hold his left arm p in the air for awhile without drifting it down, whereas on the right it drifts quickly down.  Reflexes are 1+ in the upper extremities, 2+ in the lower extremities.  I think he has right Babinski, neutral response to plantar stimulation on the left.  I cannot assess his coordination.  Sensory exam:  No response to nailbed pressure.  DIAGNOSTIC STUDIES:  CT scan of the brain showed atrophy, old small area of encephalomalacia on the right.  No clear left brain findings.  EEG shows left-sided dysrhythmia.  LABORATORY:  Sodium 137, potassium 3.4, chloride 104, glucose 95, BUN less than 5, creatinine 0.9, calcium 9.1.  CBC was essentially normal except for an MCV which was elevated at 101.5. Alcohol was less than 10.  IMPRESSION:  Left brain dysfunction.  I suspect a stroke.  What I hypothesize is that he developed new onset atrial fibrillation which precipitated an embolic left brain stroke which presented as confusion which was actually aphasia, and a mild right hemiparesis which was mixed, as well as seizure.  He already has a lowered seizure threshold with a history of seizure disorder, and embolic strokes are more likely than other types of stroke to present with seizure.  His exam and EEG point to focal left brain lesion.  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  This tends to involve the cortex surrounding the Wernickes area of  the temporal lobe posteriorly and anteriorly in the frontal region anterior to he Brocas area.   Computed tomography scans are often negative in the first 24 hours following the acute stroke.  RECOMMENDATIONS:  We will get an MRI brain of the brain with diffusion-weighted   imaging to rule out left brain ischemia.  We will probably need 2-D echo to rule out clot.  If the MRI is positive for stroke, I would strongly consider anticoagulation, although I understand he may not be a good candidate for this s he has been noncompliant with all medicines in the past.  I agree with Dilantin  300 mg a day, again compliance is a concern.  We will follow. DD:  06/28/99 TD:  06/28/99 Job: [id number]

## 2010-12-29 NOTE — Letter (Signed)
September 09, 2006    Vanetta Mulders, MD  [address] [place], [place] [address]   RE:  Chase Berger, Chase Berger  MRN:  [id number]  /  DOB:  Dec 31, 1932   Dear Dr.  Frederico Hamman:   Upon your kind referral, I had the pleasure of evaluating your patient  and I am pleased to offer my findings.  I saw Sherryl Manges in the  office today.  Enclosed is a copy of my progress note that details my  findings and recommendations.   Thank you for the opportunity to participate in your patient's care.    Sincerely,      Barbette Hair. Arlyce Dice, MD,FACG  Electronically Signed    RDK/MedQ  DD: 09/09/2006  DT: 09/09/2006  Job #: [id number]
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
## 2010-12-29 NOTE — Discharge Summary (Signed)
[place]. [facility]  Patient:    Chase Berger, Chase Berger Visit Number: [id number] MRN: [id number]          Service Type: MED Location: 3000 3039 01 Attending Physician:  Lesly Dukes Dictated by:   Candy Sledge, M.D. Admit Date:  07/01/2001 Discharge Date: 07/03/2001   CC:         [facility]                           Discharge Summary  HISTORY OF PRESENT ILLNESS:  Chase Berger is a 77 year old married male, with a history of cerebral infarct on October 2000, focal seizures and Parkinsons disease.  He presented to the emergency room with complaints of right-sided numbness.  At approximately 3:30 p.m. on the evening of admission, he began complaining of numbness involving his right side.  He kept flexing and extending his right arm.  He was unsteady with his gait and had some temporary memory problems (per his wife).  He was brought into the emergency room.  At the time of evaluation, his symptoms had improved.  There were no other spontaneous complaints.  The patient had previously been admitted in March 2002 with Dilantin toxicity. He had an MRI of the brain at that time showing an old left occipital infarct and remote right temporal head trauma.  The MRA of the intra and extracranial circulation was unremarkable.  He did have some atrial flutter noted during that hospital stay.  PAST MEDICAL HISTORY: 1. Cerebral infarct in 2000. 2. Focal right-sided seizures, which are intermittent. 3. History of Parkinsons disease, diagnosed within the past year. 4. History of alcohol abuse. 5. History of paroxysmal atrial flutter.  MEDICATIONS ON ADMISSION: 1. Requip 0.5 mg two p.o. b.i.d. 2. Aspirin 325 mg p.o. q.d. 3. Dilantin 100 mg q.a.m. and 200 mg q.h.s. 4. Keppra 500 mg one q.a.m. and 1-1/2 q.h.s. 5. Senokot.  PHYSICAL EXAMINATION ON ADMISSION:  GENERAL:  Revealed a well-developed, well-nourished gentleman lying on  a stretcher.  VITAL SIGNS:  Blood pressure 100/68, pulse 66, respirations 18, temperature 97.2.  NEUROLOGIC:  He was alert but slow to respond.  There was no evidence of aphasia.  He is oriented to the year and the month, but not the day or date. Cranial nerve examination revealed decreased spontaneous blink rate.  He had a mild mask facies.  Motor testing revealed no cod-wheeling.  There was mild right hand pronation and updrift on Barre testing.  He had good segmental strength throughout.  His gait was wide-based and unsteady, and he swayed on Robert testing.  During the examination, he had a focal seizure activity involving his right leg, lasting for about one minute on two separate occasions.  INITIAL LABS:  Done in the ER, these showed Dilantin level 13.2.  I-STAT unremarkable.  A 12-lead EKG showed sinus bradycardia.  IMPRESSION ON ADMISSION:  Left brain transient ischemic attack versus small cerebrovascular accident and left brain focal seizure activity.  PLAN:  Admit the patient to telemetry.  Obtain an MRI of the brain.  Carotid Doppler.  A 2d echocardiogram.  EEG.  LABORATORY FINDINGS:  Admission CBC and differential showed white cell count 7.5, hemoglobin 14.8, hematocrit 42.8, platelet count 339,000; neutrophils 49%, lymphocytes 39%, monocytes 60%.  Comprehensive metabolic panel:  Normal, apart from a low BUN of 4 mg/dL.  Dilantin level 13.2.  MRI OF THE BRAIN:  (with and without contrast, July 02, 2001) Showed chronic right temporal and left occipital lobe infarcts.  No acute infarct. Intracranial MRA shows right MCA branch stenosis, with multiple stenoses in the left middle cerebral artery branches.  It was indeterminate if this was a new finding.  CT SCAN: CT of the head without contrast on July 01, 2001 showed no significant change, compared with November 11, 2000.  12-lead EKG:  Showed sinus bradycardia, with otherwise normal study.  2d ECHOCARDIOGRAM:   Showed no obvious embolic force.  Ejection fraction was estimated at 55-65%.  CAROTID DOPPLER:  Showed no evidence of ICA stenosis and vertebral artery flow was antegrade.  HOSPITAL COURSE:  The patient was admitted to the neurosciences unit.  He underwent the above studies without difficulty.  Vital signs remained stable during his brief hospitalization, with low to normal blood pressures.  P.O. intake was adequate and he was afebrile.  On the day following admission, the patient was noted to be alert and in no acute distress.  No further seizure activity was observed.  An EEG showed left temporal slowing and sharp waves.  There was no evidence of an acute infarct.  On July 03, 2001, the patient was doing well; was oriented generally, but not quite to the date.  Again, no recurrent seizure activity was noted.  He was ambulating well.  At that point it was felt that he had arrived at maximum hospital benefit, but the plan was to increase his dose of Keppra for improved seizure control.  FINAL DIAGNOSES: 1. Left brain focal seizure activity. 2. Cannot rule out transient ischemic attack. 3. History of Parkinsons disease. 4. History of left occipital infarct. 5. History of right temporal head trauma. 6. History of alcohol abuse. 7. History of paroxysmal atrial flutter.  DISCHARGE MEDICATIONS: 1. Dilantin 100 mg q.a.m.; 200 mg q.h.s. 2. Keppra 500 mg 1-1/2 tablets q.a.m. and evening. 3. Aspirin 325 mg p.o. q.d. 4. Plavix 75 mg p.o. q.d. 5. Requip 0.5 mg two tablets p.o. b.i.d. 6. Senokot as directed.  DISPOSITION:  The patient is discharged to home, with instructions to follow up with Demetrio Lapping, P.A. in three to four weeks in our office.  CONDITION ON DISCHARGE:  Stable.  PROGNOSIS:  Good. Dictated by:   Candy Sledge, M.D. Attending Physician:  Lesly Dukes DD:  08/20/01 TD:  08/21/01 Job: [id number]

## 2010-12-29 NOTE — Consult Note (Signed)
[place]. [facility]  Patient:    Chase Berger, Chase Berger                      MRN: [id number] Proc. Date: 10/14/00 Adm. Date:  [id number] Attending:  Farley Ly                          Consultation Report  CHIEF COMPLAINT:  Altered mental status.  HISTORY OF PRESENT ILLNESS:  Mr. Cinquemani is a 77 year old man with a history of seizure disorder, Parkinsons disease, admitted October 12, 2000 with weakness, confusion, unsteady gait of uncertain variation.  The patient is unable to provide me with any meaningful history.  The patient has been seen by me about a month ago in the office with a history of being seen initially on June 28, 1999 with seizure, focal component on the right, presumed secondary to stroke or some such thing.  He had been seen in the hospital by Dr. Marlan Palau on July 15, 2000 under similar circumstances as this admission with ataxia, tremor, history of alcohol abuse, seizures, atrial fibrillation, stroke, question of Parkinson.  He has had aphasia with right hemiparesis noted on June 28, 1999.  No evidence of acute stroke at that time but has had persistent speech deficit, slowness, right-sided weakness, and tremor.  Depakote was added to Dilantin due to break-through seizures and no further seizures were found.  His tremor had been worsening when I saw him on September 16, 2000.  He had intermittent right-sided tremor, lots of parkinsonian features, and I restarted him on Requip.  He was supposed to go up to 0.5 mg 2 t.i.d.  I did not document his language or cognitive exam at that time.  REVIEW OF SYSTEMS:  Unavailable from the patient.  PAST MEDICAL HISTORY:  Significant for right-sided tremor presumed parkinsonism or Parkinsons disease.  History of aphasia.  History of alcoholism, ataxia, seizures, atrial fibrillation, hypertension, Dilantin toxicity on December 2001 and again this time around with Dilantin around 26 on  admission.  MEDICATIONS: 1. Senokot. 2. Depakote 500 mg b.i.d. 3. Aspirin 325 mg q.d. 4. Lovenox 40 mg q.d. 5. Protonix 40 mg q.d. 6. Dilantin restarted at 300 mg q.h.s.  He was on 200 mg b.i.d. as of    September 16, 2000.  ALLERGIES:  No known drug allergies.  SOCIAL HISTORY:  He is married, smoker.  History of alcoholism.  FAMILY HISTORY:  Positive for coronary artery disease and alcoholism.  PHYSICAL EXAMINATION:  VITAL SIGNS:  Temperature is 98, pulse 66, blood pressure 122/64, respirations 20.  HEENT:  Normocephalic and atraumatic.  NECK:  Supple without bruits.  LUNGS:  Clear to auscultation.  HEART:  Regular rate and rhythm.  EXTREMITIES:  Without edema.  NEUROLOGICAL:  Mental status:  He is awake and alert.  He may have receptive aphasia greater than expressive.  He does not answer questions appropriately. There is some perseveration and some near answers.  He is able to name objects, repeat sentences, but does not seem to comprehend well enough to answer complex questions or follow complex commands.  He can follow some simple commands.  He is oriented to [facility], [place], Monday the 4th 2002, but cannot tell me what month it is.  Cranial nerves: Pupils are equal and reactive.  Visual fields appear to be intact, although he is not very cooperative and cannot really judge that.  He does not blink to threat.  Extraocular movements are intact.  Facial sensation:  There is normal facial motor.  Pupils normal.  Hearing is intact.  Palate is symmetric. Tongue is midline.  He has some bradykinesia.  Motor exam:  He has prominent right-sided tremor which disappears briefly with action.  Good strength bilaterally without drift.  Gait was not tested.  Reflexes are 1+ with downgoing toes.  Coordination:  Finger-to-nose and heel-to-shin are intact. Sensory exam was intact.  LABORATORY DATA:  MRI again shows no acute or subacute infarction.  There  is an old left occipital lobe infarct over the right temporal region, gelosis, or trauma, generalized atrophy, and sinusitis.  Sounds like no significant change from the previous scans dated November 2001 or March 2001.  MR angiogram of the brain and neck were normal intracranial and parotids.  Admission Dilantin was 26.  It was held and it has come down to 17.  He has been restarted on Dilantin 300 mg a day which is a 100 mg less per day.  White blood cell count 9.1, hemoglobin 12.7, hematocrit 38.4, platelet 259,000. Sodium 144, potassium 4.3, chloride 105, CO2 32, glucose 85, BUN 6, creatinine 0.9, calcium 8.6.  IMPRESSION: 1. Probable Parkinsons disease with right tremor, now he is off Requip. 2. Ataxia. 3. Repeated problems Dilantin toxicity.  Dilantin dose is now lower. 4. Confusion versus aphasia or some combination of the both.  It is really    hard to tell if the confusion is secondary to substantial receptive or    expressive aphasia problem.  No evidence of new brain disease by MRA.  RECOMMENDATIONS:  Agree with reduced dose of Dilantin.  Depakote is therapeutic at 60.6 and we will go up slightly as the Dilantin comes down. One could continue to simply slowly taper the Dilantin off since there has been a lot of trouble with it and if needed consider adding Zonegran or Keppra to the Depakote for break-through seizures if they occur.  If he has had chronic severe alcoholism as in his atrophy, he may be chronically ataxic and the Dilantin may just exacerbate that.  Would restart Requip as soon as possible since his tremors seem significant and may be impairing.  At 0.5 mg 2 tablets t.i.d. is only 3 mg a day, the range is up to 18 mg, so it is too low a dose to say that it is not effective. It is unclear that there is anything else to be done at this juncture.  Should he go into a nursing home instead back to home I wonder. DD:  10/14/00 TD:  10/15/00 Job:  [id number]

## 2010-12-29 NOTE — Discharge Summary (Signed)
. [facility]  Patient:    Chase Berger                       MRN: [id number] Adm. Date:  [id number] Disc. Date: 07/07/99 Attending:  Farley Ly Dictator:   Gwenlyn Perking, M.D. CC:         Gustavus Messing. Orlin Hilding, M.D.             [facility]             Attention:  Dr. Orvan Falconer                           Discharge Summary  ADMISSION DIAGNOSES: 1. Altered mental status with seizures. 2. History of alcohol abuse. 3. Hypertension. 4. Metabolic acidosis. 5. Atrial fibrillation. 6. Tobacco abuse.  DISCHARGE DIAGNOSES: 1. Altered mental status with seizures, no further seizure activity, but    the patient is aphasic.  The aphasia is of the receptive type. 2. History of alcohol abuse. 3. Hypertension. 4. Metabolic acidosis. 5. Atrial fibrillation. 6. Tobacco abuse.  DISCHARGE MEDICATIONS: 1. Dilantin 100 mg three capsules p.o. q.d. 2. Pepcid 20 mg one p.o. b.i.d. 3. Multivitamin with zinc and folate one p.o. q.d. 4. Aspirin 81 mg one p.o. q.d. 5. Potassium chloride such as K-Dur or Micro-K 20 mEq one p.o. q.d.   RESIDENTS:  Dr. Jannette Fogo and Dr. Gwenlyn Perking.  CONSULTATION:  Neurology, Dr. Gustavus Messing. [facility] on June 28, 1999.  PROCEDURE: 1. A 2-D echocardiogram. 2. MRI with diffusion on June 28, 1999. 3. Electroencephalogram on June 27, 1999.  HISTORY OF PRESENT ILLNESS:  The patient is a 77 year old African-American gentleman who presented on June 25, 1999, to the emergency room, brought in by the family, the patients wife and daughter.  The patient presents with confusion. While in the emergency room the patient also experienced a tonic-clonic seizure  which was observed by the nursing staff in the emergency room.  PAST MEDICAL HISTORY:  Significant for alcohol abuse, hypertension, and atrial fibrillation, though questionable date of onset unknown, and a history of  a seizure disorder, presumptively secondary to alcohol abuse.  The patient also has a history of tobacco use.  The patient has had problems with a right flank pain the night  before presentation, and took some relief for that.  On the morning of admission when he woke up the wife noted him to be confused, and therefore took him to the emergency room.  The wife denies observing seizure activity at home.  While here in the emergency room waiting to be seen by the emergency room physician, the patient had a seizure of a generalized tonic-clonic type, observed by the nurse, and the patient was intubated for airway protection and given Ativan.  This measure caused the patient to stop seizing.  The past medical history is significant alcohol abuse, tobacco abuse, seizure disorder, and hypertension.  PAST SURGICAL HISTORY:  None.  CURRENT MEDICATIONS:  None.  ALLERGIES:  No known drug allergies.  SOCIAL HISTORY:  The patient lives with his wife.  Has six children.  The patient has abused alcohol and tobacco for many years.  The patient is not interested in quitting, according to the family.  The family denies knowing about other drug abuse.  REVIEW OF SYSTEMS:  Per the family, not significant for vomiting.  No recent weight loss, no recent confusion  or memory loss, no recent history of fevers.  PHYSICAL EXAMINATION:  VITAL SIGNS:  Temperature 99.0 degrees, blood pressure 116/63, pulse 120, respirations 16.  GENERAL:  A well-developed, well-nourished 77 year old African-American male, intubated, but responsive to painful stimuli.  HEENT:  Normocephalic, atraumatic.  Pupils equal, round, reactive to light and accommodation.  Extraocular muscles intact.  Nose and ears without discharge or  lesions.  Oropharynx is not assessed.  NECK:  No bruits, no lymphadenopathy.  CARDIOVASCULAR:  Irregularly irregular rate without murmurs, rubs, or gallops.  LUNGS:  Bilateral  rhonchi.  ABDOMEN:  Soft, nontender, nondistended, with normoactive bowel sounds.  No hepatosplenomegaly.  EXTREMITIES:  No cyanosis, clubbing, or edema.  NEUROLOGIC:  Cranial nerves grossly intact.  The patient is intubated.  No gross motor deficits.  Moves extremities spontaneously.  RECTAL:  Normal tone.  LABORATORY DATA:  H&H 15.6 and 46.6 respectively, white blood cell count 11.6, platelets 318.  Sodium 135, potassium 3.2, chloride and CO2 of 97 and 27 respectively.  BUN 5, creatinine 1.1.  Glucose 164.  Urinalysis is positive for  protein, 6-10 red blood cells, and positive for bacteria.  Urine drug screen is  negative.  Serum alcohol level is less than 10.  Salicylate level is less than .4. ABG:  7.489/37.1/28.0 on an FIO2 of 100%.  CPK 208, MB 0.7, relative index of 0.3. The next one eight hours later was 1998 CPK and MB fraction 1.3, with a relative index of 1.3.  A CT of the head at presentation revealed an unchanged appearance of the intracranial structures with peripheral hypodensity in the right temporal lobe,  likely representing encephalomalacia from prior infarction, mild mucoperiosteal  thickening in the ethmoid air cells, likely secondary to nasal intubation.  No acute intracranial abnormalities or evidence.  This was compared to a CT in March 2000.  Electrocardiogram revealed atrial fibrillation with rapid ventricular response, ST depressions in V4 and V5, normal axis.  ASSESSMENT:  At presentation, a 77 year old African-American male with a history of alcohol abuse, status post seizure, admitted for mental status changes. An acute stroke is ruled out by a CT.  The patient is currently intubated for airway protection.  The patient also with atrial fibrillation of unknown date of onset. No previous electrocardiograms are available for comparison at the time of presentation.  Mental status changes likely postictal.  PLAN:  To admit the patient to  MICU for vent control.  Will also attempt to wean  the patient off of the vent.  Furthermore, the patient will be observed and provided with supportive care.  Will investigate the cause for atrial fibrillation further.  HOSPITAL COURSE:  The patient was hospitalized with the above plan, and was able to be extubated without any problems on June 26, 1999.  The patient tolerated his well.  There was no seizure activity observed while the patient was in the intensive care unit.  It was presumed that the seizure was likely due to alcohol withdrawal.  The atrial fibrillation was converted with IV Cardizem 10 mg per hour, after a 25 mg load; however, the patient continued to be confused.  The patient was empirically treated for a urinary tract infection/aspiration pneumonia with Levaquin.  On June 27, 1999, it became apparent that the patient was alert;  however, not oriented.  He did not follow commands. This was suspicious for an aphasic situation of the receptive type.  His vital signs continued to be stable and no signs or symptoms suggested an occurrence of  delirium tremens; however, he patients mental status examination was altered by the fact that he was alert but not oriented.  Therefore neurology was consulted and an electroencephalogram was obtained.  The neurology consultation suggested an MRI with diffusion study. This MRI revealed the following:  Atrophy and small vessel disease, old left temporal infarction or brain substance loss, secondary to trauma.  No acute areas of infarction or abnormal enhancement, periventricular small vessel disease, and chronic sinusitis of the sphenoid sinus.  The electroencephalogram revealed a left hemispheric dysrhythmia.  Neurologys impression was that the patient has a left  brain dysfunction from a probable CVA, and it was hypothesized that the patient had a new onset atrial fibrillation which precipitated embolization, with  resulting  left brain infarction; however, this could not be demonstrated by the MRI, with no lesion on the MRI explaining the patients aphasia.  In the ensuing days, the patient recovered medically fairly nicely, and remained afebrile and was normotensive; however, remained aphasic.  Neurology recommended to put the patient on Dilantin for his seizure disorder, and possibly recheck an electroencephalogram for possible seizure disorder, which may be responsible for the patients aphasia.  DISPOSITION:  On July 07, 1999, it was decided that the patient had benefited maximally from this hospital admission, and could be discharged to a nursing home at [facility] safely.  CONDITION ON DISCHARGE:  Stable.  INSTRUCTIONS: ACTIVITIES:  Ambulation with assistance only.  Physical therapy to help the patient with reconditioning.  DIET:  Regular diet.  SPECIAL INSTRUCTIONS:  Should any problems arise such as seizure activity or a change in neurologic status, please refer the patient back to [place] to the [facility].  The patient needs also a Dilantin level drawn, as well as an albumin, a complete metabolic panel, and a CBC with differential on July 10, 1999.  Dr. Orvan Falconer, the nursing home doctor is to adjust the Dilantin dose accordingly.  The patient also needs to have follow-up electroencephalogram in one to two weeks.  The patients neurologist is Dr. Santina Evans A. Weymann.  ALLERGIES:  No known drug allergies.  FOLLOWUP:  The patient is to have followup as recommended by Dr. Orvan Falconer for this. The patient is to follow up with Dr. Orlin Hilding for the electroencephalogram in one to two weeks.  The nursing home is to make the appointment for the patient.  DD: 09/05/98 TD:  07/07/99 Job: [id number]

## 2010-12-29 NOTE — Discharge Summary (Signed)
[place]. [facility]  Patient:    Chase Berger, VELEY                      MRN: [id number] Adm. Date:  [id number] Disc. Date: [id number] Attending:  Lorre Nick Dictator:   Gwenlyn Found CC:         Gustavus Messing. Orlin Hilding, M.D.             Marlan Palau, M.D.             Madaline Guthrie, M.D.             [facility]                           Discharge Summary  DATE OF BIRTH:  [birth year]  DISCHARGE MEDICATIONS: 1. Dilantin 200 mg p.o. b.i.d. 2. Depakote 750 mg p.o. q.8h. 3. Propranolol 5 mg p.o. b.i.d. 4. Aspirin 325 mg p.o. q.d.  DISCHARGE DIAGNOSES: 1. Seizure disorder. 2. Receptive aphasia with visual field defect secondary to #1. 3. Hypokalemia, resolved. 4. Hypertension. 5. Tobacco abuse. 6. Benign right hand tremor. 7. History of alcohol abuse.  CONSULTANTS: 1. Marlan Palau, M.D. (neurology). 2. Speech therapy.  OPERATION/PROCEDURE: 1. EEG on September 19, 1999 that was read as showing the following impression:    Abnormal EEG recording, showing evidence of diffuse slowing.  In addition,    there is periodic lateralized epileptiform discharge in the left parietal    region, most likely representing an ictal focus of seizure activity. 2. EEG on September 26, 1999 impression was improved EEG although showing evidence    of diffuse slowing as well as some periodic lateralized epileptiform discharge    in the left hemisphere.  These were less prominent than on the previous study. 3. CT of the head without contrast media on September 18, 1999 showing no acute    intracranial process.  There is moderate atrophy with mild periventricular    white matter chronic change.  There is no definite change in the size of the    prominent ventricles.  There is an old infarct noted in the right temporal    region. 4. MRI/MRA of the brain on September 18, 1999 showed several areas of increased    signal just above the  posterior limb of the internal capsule and the    subcortical white matter adjacent to the inflow of cortex.  This finding was    suspicious for a subacute deep white matter infarct.  This area was not seen    on previous images on June 28, 1999, there appeared significant atrophic  changes present along with focal atrophy in the right temporal region, probably    from previous trauma).  There are mild changes of small vessel disease.  MRA    shows small intracranial atherosclerotic disease, especially appreciated in    the distal MCA branches.  CHIEF COMPLAINT:  Aphasia.  HISTORY OF PRESENT ILLNESS:  Chase Berger is a 77 year old African-American male with a history of alcohol abuse, with withdrawal seizures, seizure disorder, hypertension, and seizure in November 2000 with possible embolic CVA.  He presented status post a seizure today with resulting receptive aphasia.  The patient was discharged to [facility] back in November, where he made progress and as discharged to home on August 22, 1999.  He was about 80 per cent back to his baseline in  January, with ability to read the paper, perform his own ADLs, and interact with the family in an appropriate manner.  The family did notice that e continued to have some slow responses to questions and had a right hand resting  tremor.  On September 13, 1999 he was seen by Dr. Gwenlyn Found at the [facility], where he was started empirically on propranolol for his tremor and dilantin level checked was 8.7.  Today the patient was going to the drugstore when he felt like he was going to pass out.  He sat down and leaned his head over and when he lifted it later was found to be aphasic.  He had no overt signs of  seizure and no bowel or bladder incontinence.  PAST MEDICAL HISTORY: 1. History of alcohol abuse. 2. Hypertension. 3. History of atrial fibrillation back in November 2000.  The patient  converted to    normal sinus dysrhythmia after rate control with Cardizem. 4. Tobacco abuse. 5. Altered mental status with seizure and receptive aphasia in November 2000. 6. History of seizure disorder, questionable secondary to history of alcohol    abuse. 7. Right hand resting tremor.  ALLERGIES:  No known drug allergies.  MEDICATIONS: 1. Propranolol 5 mg p.o. b.i.d. 2. Dilantin 300 mg p.o. q.h.s. 3. K-Dur 20 mEq p.o. q.d. 4. Zinc sulfate 220 mg p.o. q.d. 5. Multivitamin 1 p.o. q.d. 6. Aspirin 81 mg p.o. q.d.  SOCIAL HISTORY:  The patient was discharged from [facility] on August 22, 1999.  He currently lives at home with his wife.  He has six adult children. Tobacco:  Five to six cigarettes per day since his teens.  Alcohol:  He quit drinking in November 2000.  He was a heavy drinker in the past.  He would black out and has had withdrawal seizures.   REVIEW OF SYSTEMS:  Positive for feeling like he was going to pass out. Negative for bowel or bladder incontinence, and loss of consciousness, as well as fever,  chills, nausea and vomiting, diarrhea or constipation, chest pain, or palpitations.  PHYSICAL EXAMINATION:  VITAL SIGNS:  Temperature 98.7 degrees, blood pressure 129/82, pulse 75, respiratory rate 18.  Oxygen saturation 96 per cent on room air.  GENERAL:  He was lying in bed, in no acute distress.  There is a paucity of facial expression.  SKIN:  Warm and dry.  NECK:  Supple, with no lymphadenopathy, bruits, or JVD.  HEENT:  Head normocephalic, atraumatic.  PERRLA.  Sclerae anicteric. Extraocular movements not tested secondary to to inability of the patient to follow commands. Oropharynx without erythema or exudate.  Nares patent bilaterally.  Tympanic membranes normal bilaterally.  LUNGS:  Clear to auscultation bilaterally with good air movement.  HEART:  Regular rate and rhythm with normal S1 and S2 and a positive S3.   ABDOMEN:  Soft,  nontender, with normoactive bowel sounds.  No hepatosplenomegaly.  EXTREMITIES:  There is no edema.  There are 2+ bilateral pedal pulses and peripheral pulses throughout.  NEUROLOGIC:  The patient does not follow commands.  Responds to everything with, "um-hum".  The toes are downgoing on the right and questionably upgoing on the left.  The deep tendon reflexes are 2+ throughout.  There is a right hand resting tremor.  Extraocular movements not tested secondary to inability of the patient to follow commands.  LABORATORY DATA:   Dilantin level 7.0.  Sodium 135, potassium 4.7, chloride 104, CO2 28, BUN 13,  creatinine 1.0, glucose 97.  Calcium 9.3, total protein 7.1, albumin 12.0.  WBC 8.7, hemoglobin 15.2, platelets 338,000; MCV 95.  PT 13.5, INR 1.1, PTT 34.  SGOT 33, SGPT 18.  Alkaline phosphatase 78, total bilirubin 0.8.  Ethanol level less than 10.  Ammonia level 30.  EKG showed sinus bradycardia with heart rate of 59; there is sinus arrhythmia and there is some slight ST elevation in V2 and V3.  ASSESSMENT:  Chase Berger is a 77 year old African-American male with a history of  seizure disorder, in the past having been presumed to be secondary to alcohol abuse, his most recent seizure being in November 2000 with resultant aphasia. is previous EEG showed left hemispheric dysrhythmia, and he now presents with aphasia which most likely represents a post ictal state.  PLAN:  The plan for the patient was to admit him, with a goal of getting him therapeutic on anti-epileptic medications with the understanding that the patients recovery from his aphasia will be a slow, long process.  HOSPITAL COURSE: #1 - SEIZURE WITH RECEPTIVE APHASIA AND VISUAL FIELD DEFICITS:  The patient was  admitted to the medical teaching service under the direction of Dr. Madaline Guthrie. Dr. Lesia Sago was called in for neurologic consultation.  The patient was initially started on oral Dilantin but  this was converted to an IV Dilantin load per Dr. Wyvonnia Lora recommendation.  On September 19, 1999 the patient had an EEG that was read by Dr. Avie Echevaria that showed PLEDs.  There was evidence of brain dysfunction.  On September 20, 1999 the patient was started on IV Depakote load with the plan of getting this medication within therapeutic range.  The patient made  very slow and gradual recovery from his aphasia.  Throughout the hospitalization he continued to answer to questions in an inappropriate manner, and at times had inappropriate behaviors.  On September 29, 1999 his medication with within therapeutic ranges and thus the patient was discharged on Dilantin 200 mg p.o. b.i.d., Depakote 750 mg p.o. q.8h.  The patient was to follow up with repeat dilantin and depakote levels in the [facility] on October 05, 1999.  Speech therapy was also brought in for possible assistance with recovery  from the patients aphasia.  This is to continue upon discharge.  #2 - ALCOHOL ABUSE:  The patient was started on thiamine and folate.  The patients alcohol level at the time of admission was less than 10, and the family again reiterated they do not believe that the patient had had anything to drink since  November of 2000.  The patient was not discharged on these two medications.  #3 - TOBACCO ABUSE:  I discussed with the patient the need to stop smoking cigarettes.  The patient nodded limited understanding of these instructions.  #4 - TREMOR:  The patient had presented to the [facility] in January 2001 with a benign resting right hand tremor.  The patient continued to  have this tremor throughout his hospitalization and thus propranolol 5 mg p.o. b.i.d. was resumed.  DISPOSITION:  The patient was discharged home on September 29, 1999 under the care of his family and was to continue to have [facility] therapy [facility].  FOLLOW-UP:  The patient is to  follow up at the [facility] with Dr. Gwenlyn Found on Thursday, October 05, 1999, at 2 p.m.  At this time his  Dilantin and depakote levels were to be  tested. DD:  10/29/99 TD:  10/30/99 Job: [id number]

## 2010-12-29 NOTE — Discharge Summary (Signed)
[place]. [facility]  Patient:    Chase Berger, Chase Berger                      MRN: [id number] Adm. Date:  [id number] Disc. Date: [id number] Attending:  Phifer, Harriett Sine Welcome Dictator:   Dara Hoyer, M.D. CC:         Alvester Morin, M.D.             Madaline Guthrie, M.D.                           Discharge Summary  DATE OF BIRTH:  03-May-1933  DISCHARGE DIAGNOSES: 1. Partial seizure with Todds paralysis. 2. Chronic seizure disorder with history of Todds paralysis and receptive    aphasia. 3. History of alcohol abuse with withdrawal seizures. 4. 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 5. Atrial fibrillation. 6. Hypertension. 7. Right hand tremor.  DISCHARGE MEDICATIONS: 1. Depakote 750 mg b.i.d. 2. Aspirin 325 mg q.d. 3. Dilantin 200 mg b.i.d.  HOSPITAL FOLLOW-UP: 1. Outpatient clinic follow-up with Dr. Wyonia Hough on Dec 28, 1999. 2. Outpatient clinic follow-up on December 11, 1999, to check Depakote and Dilantin levels.  PROCEDURES AND RESULTS:  CT of the head on December 01, 1999, showing no evidence of acute disease within the brain, and an old right lateral temporoparietal infarct which had not changed.  HISTORY OF PRESENT ILLNESS:  A 77 year old African-American gentleman with a history of seizure disorders and CVA brought to [facility] emergency department on December 01, 1999, with sudden onset of dizziness (not vertigo), right-sided weakness, mental status changes.  Wife noted patient to be "twitching all over," with no loss of consciousness, no bowel or bladder incontinence.  Patient also complained of diffuse headache at that time with nausea but no vomiting, no visual changes, no nuchal rigidity, and no fever or chills.  Per his wife, he has been compliant with his anti-seizure medications. During this spell, he became weak on his right side and collapsed on the bed.  HOME MEDICATIONS: 1. Aspirin 325 mg q.d. 2.  Dilantin 200 mg b.i.d. 3. Depakote 500 mg b.i.d. 4. Propranolol 500 mg b.i.d.  SOCIAL HISTORY:  He lives in [place] with his wife and had been in a nursing home until August 22, 1999, secondary to convalescence from CVA in November.  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  Approximately 20-pack-year smoking history.  REVIEW OF SYSTEMS:  Otherwise unremarkable.  Denies chest pain, palpitations, shortness of breath, cough, voice changes, dysphagia, visual or hearing loss.  PHYSICAL EXAMINATION:  VITAL SIGNS:  Temperature was 98.4, pulse 65, respiratory rate 20, blood pressure 113/77, 96% O2 saturations on room air.  GENERAL:  He was alert, lying in bed in no apparent distress.  HEENT:  Significant for gingival hyperplasia.  NECK:  Supple with no bruits.  CARDIOVASCULAR:  Regular rate and rhythm, normal S1 and S2, with strong symmetrical distal pulses.  LUNGS:  Fine crackles at bilateral bases with good air movement.  EXTREMITIES:  No clubbing, cyanosis, or edema.  ABDOMEN:  Benign.  NEUROLOGIC:  Patient was alert and oriented x 4, and exhibited slow speech with appropriate answers.  There was no observed aphasia; however, his affect was restricted.  Cranial nerves II-XII were  grossly intact except for mild left nasolabial fold flattening, and visual fields were also grossly intact. Strength was 5/5 in all extremities and symmetrical.  No dysdiadochokinesia in upper or lower extremities.  There was right pronator drift and a coarse tremor in the right arm slightly greater with intention then at rest.  Intact finger-to-nose.  Decreased vibratory and temperature sensation in the right lower extremity distal to the knee.  DTRs were roughly 2+ and symmetrical in upper and lower extremities bilaterally, with downgoing  toes bilaterally.  LABORATORIES ON ADMISSION:  White blood cell count 7.9, H&H was 14.7 and 43.6, platelets were 302.  Sodium was 131, potassium 4.0.  INR, PT was 1.2 and 14.2. Phenytoin level was 17 and valproic acid level was 22.8.  A CT of his head showed old right temporal lobe infarct with no new bleeds. EKG showed normal sinus rhythm, normal axis and intervals, and chest x-ray was negative.  HOSPITAL COURSE: #1 - SEIZURE WITH TODDS PARALYSIS:  In light of the patients subtherapeutic valproic acid and prior history of similar-type spells which showed to be seizure, it was felt that the episode patient was describing was a seizure. His Depakote was increased to 750 mg b.i.d. and his serum levels monitored daily.  During the course of his hospitalization, he had speech which became noticeably more fluent.  His neurological exam continued to be nonfocal except for a right hand and arm tremor, which patient has had since the time of his CVA in November.  At time of patients discharge, valproic acid level was 52. He had no recurrence of this spell during his admission, and went home with plans to follow up on April 30 for evaluation of Depakote and Dilantin levels in outpatient clinic.  #2 - HYPONATREMIA:  Patient had a sodium of 131 on admission.  Examining prior electrolyte studies from the patients hospitalizations at [facility], he had had no recent history of hyponatremia.  The patient was not on diuretics or having a history of other possible sources of electrolyte losses.  Repeat sodium level on April 21 and April 22 showed sodium of 141 on both days.  It was suspected that the initial sodium level of 131 was an anomaly or possibly a lab error.  No intervention was deemed necessary at that time.  DISCHARGE LABORATORIES:  White blood cell count 7.9, hemoglobin 14.7, hematocrit 43.6, platelets 302.  PT 14.2, INR 1.2.  Sodium 141, potassium 4.2,  CL 104, CO2 33,  glucose 98, BUN 14, creatinine 0.8, calcium 9.6.  Phenytoin 17, valproic acid 5.2. DD:  12/06/99 TD:  12/06/99 Job: [id number]

## 2010-12-29 NOTE — Assessment & Plan Note (Signed)
[facility]                         GASTROENTEROLOGY OFFICE NOTE   Chase Berger, Chase Berger                      MRN:          [id number]  DATE:09/09/2006                            DOB:          May 28, 1933    REASON FOR CONSULTATION:  Colonoscopy.   REASON:  Chase Berger is a pleasant 77 year old African American male  referred through the courtesy of Dr. Frederico Hamman for evaluation. Except for  occasional constipation he has no GI complaints. Specifically, he is  without change in bowel habits, abdominal pain, melena, or hematochezia.   PAST MEDICAL HISTORY:  Pertinent for a seizure disorder, and Parkinson  disease.   FAMILY HISTORY:  Noncontributory.   MEDICATIONS:  Requip, Aggrenox, and Keppra.   He has no allergies. He smokes about a pack a week. He does not drink.  He is married and retired.   REVIEW OF SYSTEMS:  Reviewed and is negative.   PHYSICAL EXAMINATION:  Pulse 80, blood pressure 112/74, weight 220.  HEENT: EOMI. PERRLA. Sclerae are anicteric.  Conjunctivae are pink.  NECK: Supple without thyromegaly, adenopathy or carotid bruits.  CHEST: Clear to auscultation and percussion without adventitious sounds.  CARDIAC: Regular rhythm; normal S1 S2.  There are no murmurs, gallops or  rubs.  ABDOMEN: Bowel sounds are normoactive.  Abdomen is soft, nontender and  nondistended.  There are no abdominal masses, tenderness, splenic  enlargement or hepatomegaly.  EXTREMITIES: Full range of motion.  No cyanosis, clubbing or edema.  RECTAL: Deferred.   IMPRESSION:  1. Parkinson disease.  2. Seizure disorder.   RECOMMENDATION:  Screening colonoscopy.     Barbette Hair. Arlyce Dice, MD,FACG  Electronically Signed   RDK/MedQ  DD: 09/09/2006  DT: 09/09/2006  Job #: [id number]   cc:   Vanetta Mulders, MD

## 2010-12-29 NOTE — Discharge Summary (Signed)
[place]. [facility]  Patient:    Chase Berger                       MRN: [id number] Adm. Date:  [id number] Disc. Date: [id number] Attending:  Alfonso Berger                           Discharge Summary  DICTATED FOR:  Chase Berger, M.D.  DATE OF BIRTH:  November 08, 1932  PHYSICIANS:  Attending, Chase Berger, M.D.  Resident: Chase Berger, M.D.  ______  DISCHARGE DIAGNOSES: 1. Possible community-acquired pneumonia. 2. Partial seizures with Todds paralysis. 3. Cerebrovascular accident. 4. Atrial fibrillation. 5. Hypertension. 6. History of ethanol abuse with withdrawal seizures. 7. Right-handed tremor.  DISCHARGE MEDICATIONS: 1. Valproic acid 1000 mg b.i.d. 2. Dilantin 200 mg b.i.d. 3. ASA. 4. Propranolol 80 mg b.i.d.  FOLLOWUP:  The patient has a followup lab appointment for valproic acid level on March 19, 2000, at 9:30 and has an appointment with Dr. Wyonia Berger in internal medicine clinic October 10 at 3 p.m.  PROCEDURES: 1. Chest x-ray March 10, 2000, showing possible cardiomegaly, no infiltrates. 2. Head CT March 10, 2000, no acute changes. 3. Lumbar puncture in the ED on March 10, 2000, unsuccessful. 4. Fluoroscopically guided lumbar puncture, March 11, 2000.  CONSULTATIONS:  None needed.  BRIEF HISTORY OF PRESENT ILLNESS:  The patient is a 77 year old African-American male who presented on March 10, 2000, with his wife.  The patient was unable to give most of the history on admission and was received by the wife.  The patient has a history of partial seizures, alcohol abuse, CVA, atrial fibrillation, and hypertension.  He presented with his wife who complained of possible seizure activity that morning.  The wife reported that the patient was in the bathroom when the wife noticed that the patient was experiencing generalized "shaking."  The patient was alert and talkative during that time but "wasnt to himself."  The wife  denied rigidity, tongue biting, fecal or urinary incontinence, or any loss of consciousness.  Negative diaphoresis, fever, nausea, vomiting, chest pain, or shortness of breath.  The patient was able to walk to the bed during this episode and remained in his shaking state until the paramedics arrived which was approximately  30 minutes.  The wife also reported positive nonproductive cough and rhinorrhea for approximately two days and noted the patient had in general been sleeping more.  The patient denied any recent illnesses, headache, new weakness, numbness, or dysarthria.  The wife stated that this episode did not resemble the patients normal seizure activity which usually was limited to facial twitching.  REVIEW OF SYSTEMS:  As per HPI.  Negative urinary or bowel dysfunction, negative visual changes.  PAST MEDICAL HISTORY: 1. Partial seizures with Todds paralysis with visual field defects and    receptive aphasia.  The patients last seizure was reported to be March 06, 2000, which was his normal seizure activity limited to only the face.  The    wife reports approximately one to two seizures per month.  The patient has    history of subtherapeutic Depakote levels and seizure activity secondary    to that.  The patients Depakote was increased to 750 mg b.i.d. on March 04, 2000. 2. CVA in November 2000, right temporal and cephalomalacia with a left    temporal lobe infarct.  The patient experienced right thigh weakness and    right hand tremor currently. 3. Atrial fibrillation 4. History of alcohol abuse with withdrawal seizures.  The patient reports no    alcohol use since November 2000. 5. Hypertension.  PAST SURGICAL HISTORY:  None.  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  He smoked one-half a pack a day for 40 years.  He is able to perform all ADLs alone and is able to walk without the aid of a walker, although he does  have one at home.  FAMILY HISTORY:  MI in the father, 28 years old; renal failure in the mother.  ALLERGIES:  No known drug allergies.  MEDICATIONS ON ADMISSION: 1. Depakote 750 mg b.i.d. 2. Aspirin 325 mg q.d. 3. Dilantin 200 mg b.i.d. 4. Propranolol 80 mg b.i.d.  PHYSICAL EXAMINATION ON ADMISSION:  Temperature 101.1, blood pressure 131/75, respiratory rate 24, pulse 98, saturations 99% on room air.  Neck is Clear. Positive left eye lacrimation.  Throat is clear.  Tympanic membranes are clear bilaterally.  No adenopathy.  Cardiovascular shows regular rate and rhythm with no murmurs, thrills, or rubs.  Pulmonary shows positive soft crackles at the left base.  Abdomen soft, nontender, nondistended, bowel sounds x 4. Extremities show trace edema, positive osteomatosis bilateral feet. Neurologic: The patient is alert and oriented x 3 but slow to respond, though when does respond is interactive.   Decreased sensation to pinprick and vibration on the right lower extremity greater than upper extremity.  Strength 4+ on right lower extremity, weaker on upper extremity and 5+ strength left side upper and lower extremities.  Deep tendon reflexes are 2+ throughout with downgoing toes.  Cerebellar function finger-to-nose and rapid alternating movements slow but normal.  The patient exhibits a resting tremor of the right hand with occasional crossover to the left.  Speech is normal.  LABORATORY DATA:  White blood cell count 19.5, neutrophils 86, lymphs 8, monocytes 4.  Sodium 138, potassium 3.4, chloride 104, bicarb 29, BUN 7, creatinine 0.8, glucose 122, total bilirubin 0.4, alkaline phosphatase 57, AST 19, ALT 19, total protein 6.6, albumin 3.5, calcium 9.2.  Dilantin level 17.2. Valproic acid level 50.1 (was 35.5 on March 04, 2000).  EKG shows normal sinus rhythm.  Head CT: No acute changes.  Chest x-ray: Cardiomegaly with no infiltrates.  HOSPITAL COURSE:  #1 - INFECTIOUS DISEASE;   The patient did have a history of seizure disorder, although the general signs and symptoms on admission ruled against seizure  activity.  Mainly, it was not the usual seizure activity that the patient experiences, and it lasted for greater than 30 minutes, and the patient was conscious, talking, and walking throughout the episode.  The patients Depakote on admission was barely therapeutic at 50.1.  Other etiologies at that time included infectious causes, and especially in light of the patients white blood cell count with a left shift and febrile state, it was felt that maybe this episode could have been due to chills secondary to an infectious etiology.  Chest x-ray and a UA were done on admission, both of which were negative.  At that time, it was felt that, given the patients acute confusion with his symptoms, with laboratory findings, and his febrile state, that meningitis was a possibility.  A lumbar puncture was attempted in the ED without success.  The patient was at that time empirically started on Rocephin 2 g and Azithromycin 500 mg  q.d. for possible infectious etiology. The next day, a fluoroscopically guided LP was done which eventually showed no organisms on Gram stain for CSF.  CSF cell count showed 1 white blood cell, 8 red blood cell, no lymphocytes, neutrophils, or monocytes, a glucose of 87, and a total protein of 46.  The patient was continued on antibiotics throughout his hospital stay and continues to improve.  His lungs became clear, and no obvious site of infection was localized.  The patient, on discharge, feels that he is back to his baseline and is experiencing no symptoms, and is discharged without any further antibiotic treatment.  #2 - HISTORY OF SEIZURE DISORDER:  It is felt that the patients presenting symptoms were most likely not due to his seizure disorder given the length of symptoms the patient experienced.  It was more likely due to an  infectious etiology.  The patient denies any seizure activity while in the hospital.  The patients valproic acid level was drawn during hospitalization was at 51.7. At that time, valproic acid was increased to 1000 mg b.i.d.  The patient is to follow up with Dr. Wyonia Berger for further management.  #3 - ATRIAL FIBRILLATION:  The patient remained in normal sinus rhythm without evidence of atrial fibrillation throughout hospitalization.  #4 - HYPERTENSION:  The patient was normotensive throughout hospitalization and was continued on propranolol.  #5 - RIGHT HAND TREMOR:  The patient continued to exhibit a right-hand tremor with occasional crossover to the left hand, but the tremor was easily extinguishable and suppressible, and it was felt at that time that the tremor was chronic and most likely due to history of CVA.  The patient is continued on propranolol.  DISCHARGE LABORATORY DATA:  White blood cells 19.8, hemoglobin 13, hematocrit 37, platelets 274, neutrophils 79, lymphocytes 15, monocytes 3.  CSF: Gram stain shows no organisms, mononuclear white blood cells.  CSF shows 1 white blood cell, 8 red blood cells, no lymphocytes, no neutrophils, no monocytes, glucose 87, total protein 46.  Phenytoin 14, valproic acid 51.7 on discharge. Blood cultures are no growth x 2 days.  UA is negative.  A CBC was done and is pending at time of discharge.  Sputum culture was unable to be collected.  DISPOSITION:  On discharge, the patient is stable and not experiencing any symptoms and is afebrile.  All workups for infectious etiology have been negative.  The patient is discharged home.  The only change to his medication is an increase in valproic acid level to 1000 mg b.i.d.  The patient has followup appointments in lab and with Dr. Wyonia Berger, primary care physician. DD:  03/12/00 TD:  03/13/00 Job: [id number]

## 2010-12-29 NOTE — Discharge Summary (Signed)
. [facility]  Patient:    Chase Berger, Chase Berger                      MRN: [id number] Adm. Date:  [id number] Disc. Date: [id number] Attending:  Farley Ly Dictator:   Susie Cassette, M.D. CC:         Madaline Guthrie, M.D.  Internal Medicine Outpatient Clinic  Catherine A. Orlin Hilding, M.D.   Discharge Summary  DISCHARGE DIAGNOSES: 1. Confusion/altered mental status, suspect Dilantin toxicity. 2. Anemia, mild, likely secondary to chronic disease. 3. History of seizure disorder. 4. Paroxysmal atrial flutter. 5. Possible Parkinsons disease.  DISCHARGE MEDICATIONS: 1. Aspirin 325 mg p.o. q.d. 2. Depakote 500 mg p.o. b.i.d. 3. Dilantin 100 mg 3 tablets q.h.s. 4. Requip 0.5 mg 2 tabs t.i.d. 5. Senokot (resume home regimen)  FOLLOW-UP APPOINTMENTS: 1. Chase Berger is to follow up with Dr. Wyonia Hough on October 29, 2000, in the    Internal Medicine Outpatient Clinic at 2 p.m. 2. He is also to follow with Chase Berger, P.A.) on    November 19, 2000, at 2:30 p.m.  PROCEDURES: 1. CT of the head done on October 12, 2000, which showed advanced atrophy but no    acute abnormality. 2. MRI of the head, neck, and brain done on October 13, 2000. 3. MRI of the brain with and without IV contrast revealed no acute or subacute    infarction.  A remote left occipital level of infarction was noted.  There    were also findings compatible with remote trauma of the right    temporal region and a focal axial hygroma and seroma and    underlying gliosis. Generalized brain atrophy was noted, and pansinusitis    was noted.  MRA of the brain was unremarkable.  MRA of the neck was    negative.  CONSULTATION:  Neurology, Dr. Orlin Hilding was consulted on October 14, 2000.  BRIEF HISTORY AND PHYSICAL:  HISTORY OF PRESENT ILLNESS:  This is a 77 year old African-American male with past medical history including history of a seizure disorder, history of atrial  fibrillation, history of CVA in November 2000, history of alcohol use, possible Parkinsons disease, who presented to the ED with his daughter with complaints of weakness, and inability to walk.  He reports he is unable to keep his balance, lethargy, nausea, and runny nose today.  The patient is alert and oriented on admission but very slow to respond and intermittently confused with difficulty with comprehension as well.  Chase Berger reports a headache and dizziness.  He denies fevers and chills, cough and chest pain, or vomiting.  He reports a poor appetite.  REVIEW OF SYSTEMS:  Negative for visual problems, abdominal pain, diarrhea, dysuria, hematuria, melena, bright red blood per rectum.  He is positive for constipation but no bowel or bladder incontinence.  PAST MEDICAL HISTORY: 1. History of seizure disorder. 2. History of atrial fibrillation. 3. History of CVA (November 200). 4. History of alcohol. 5. Possible Parkinsons disease with a right hand tremor.  FAMILY HISTORY:  NRI, renal failure.  SOCIAL HISTORY:  Positive for tobacco, no alcohol now, a retired [employer].  ALLERGIES:  No known drug allergies.  MEDICATIONS: 1. Dilantin 100, 2 tabs b.i.d. 2. Aspirin 81 mg q.d. 3. Depakote 500 mg b.i.d. 4. Requip 0.5 mg 2 tabs t.i.d. 5. Senokot.  PHYSICAL EXAMINATION:  VITAL SIGNS:  Temperature 96.6, blood pressure 115/73, pulse 60, respiratory rate 20, saturations 97%  on room air.  GENERAL:  No acute distress.  HEENT:  Normocephalic, atraumatic.  Pupils, equal, round, and reactive to light.  Extraocular muscles intact.  Oropharynx clear.  No icterus.  No nystagmus.  NECK;  Supple, no lymphadenopathy.  LUNGS:  Clear, no decreased breath sounds bilaterally.  HEART:  Regular rate and rhythm.  Normal S1 and S2.  No murmur, rub or gallop.  ABDOMEN:  Soft, nontender, nondistended, no bowel sounds.  EXTREMITIES:  Mild pitting edema, 2+ pulses.  NEUROLOGIC:  Alert, oriented  to place and time.  Cranial nerves intact. Sensation diminished in right lower extremity.  Strength 4/5 in all four extremities.  DTRs unable to elicit.  Toes downgoing, right hand resting tremor, finger-to-nose test slow, gait unsteady with shuffling, very slow to respond, somewhat confused.  LABORATORY DATA ON ADMISSION:  CBC - white count 9.1, hemoglobin 14.3, platelets 294.  BMP - sodium 137, potassium 3.6, chloride 98, bicarb 72, BUN 5, creatinine .8, glucose 113, calcium 9.1.  LFTs normal.  PT 13.5, INR 1.1, PTT 34, Dilantin level 23.5, Depakote 60.6.  CK 222, MB .3, troponin I 0.04. EKG normal sinus rhythm with no ischemic changes.  Head CT showed advanced atrophy, old infarct on the right.  HOSPITAL COURSE: #1 - CONFUSION, SLOWNESS, ALTERED MENTAL STATUS:  Chase Berger came in with symptoms of weakness, dizziness, lethargy, and balance difficulties, and was found to have Dilantin level of 23.5 which was supratherapeutic.  Chase Berger symptoms were though to be secondary likely to his Dilantin toxicity. However, because it was difficult to tell how many of Chase Berger symtpoms are new and how much of this presentation was baseline for him, an MRI was done to confirm no acute pathology.  Indeed, the MRI was significant for marked atrophy and other old changes but no new acute events.  At this time, neurology was consulted for further recommendations regarding treatment and disposition for Chase Berger.  Neurology was quite familiar with Chase Berger and had seen him multiple times in the past.  Neurology recommended decreasing his dose of Dilantin, as had already been done to 300 mg a day from his previous dose of 400 mg a day or 200 mg b.i.d.  In addition, they recommended restarting the Requip for his likely Parkinsons tremor which was stopped on  admission.  Beyond that, neurology felt that there was likely not much more to do for this patient and question whether nursing home would be a  better option than him returning home.  This was discussed with his family, but Chase Berger wife reported that she provided 24-hour care and had children available to provide assistance.  In addition, the patient has Medicaid and was therefore available for personal care services.  Therefore, the above changes were made, including decreasing the Dilantin level and restarting the Requip to the home dose, and Mr. Vangilder was discharged home with home health PT, personal care services, and additional equipment.  He was to follow up with Dr. Wyonia Hough in regards to these medical issues as well as neurology.  Prior to discharge, Mr. Huberty Dilantin level was therapeutic at 44.  He was also discharged home on the same Depakote regimen as prior to admission.  #2 - PAROXYSMAL ATRIAL FLUTTER:  Mr. Odonell was noted to have two episodes while in the hospital of atrial flutter.  The first occurred on hospital day #2.  His vitals remained stable, and he was completely asymptomatic during this episode.  In addition, his rate  was well controlled with approximately a 4 to 1 conduction.  Following that episode, Mr. Sitzer was noted to convert spontaneously back into normal sinus rhythm.  However, on the day of discharge, he did have another episode of atrial flutter.  Again, he was completely asymptomatic and maintained a regular heart rate.  Because the atrial flutter seemed to be paroxysmal with noted spontaneous conversion back to normal sinus rhythm, no aggressive treatment was planned.  There is no conclusive evidence linking paroxysmal atrial flutter with spontaneous conversion back to normal sinus rhythm with an increased risk of stroke.  In addition, Mr. Schupp would likely be a poor candidate for anticoagulation given his overall clinical picture and compliance issues.  Therefore, we treated moderately with 325 mg of aspirin per day.  More aggressive anticoagulation could be considered in the future as the  atrial flutter became his persistent rhythm or if atrial fibrillation were noted.  #3 - ANEMIA:  Mr. Pottinger was noted to be mildly anemic during his hospital stay.  An anemia work-up was done which revealed a normal iron, elevated ferritin, normal B12, and normal folate.  In addition, on the day of discharge, his hemoglobin was within normal limits.  Therefore, no further treatment was indicated, and it is likely that Mr. Monday will spontaneously correct as an outpatient.  #4 - PROBABLE PARKINSONS DISEASE:  Again, neurology did evaluate Mr. Klemann and felt that this was probable Parkinsons disease and recommended restarting his Requip.  This was done prior to discharge, and Mr. Ausborn is to follow up with neurology as an outpatient.  DISCHARGE LABORATORY DATA:  CBC - white count 7.7, hemoglobin 13.4, platelets 280, blood cultures negative x 3 days.  Dilantin 18, free Dilantin level 3.3.  DISPOSITION:  Mr. Welchel was discharged to home in good condition. DD:  10/24/00 TD:  10/24/00 Job: [id number]

## 2010-12-29 NOTE — Discharge Summary (Signed)
[place]. [facility]  Patient:    Chase Berger, Chase Berger                      MRN: [id number] Adm. Date:  [id number] Disc. Date: 07/17/00 Attending:  Farley Ly CC:         Outpatient Clinic  Madaline Guthrie, M.D.  Catherine A. Orlin Hilding, M.D.,  [facility]   Discharge Summary  ATTENDING PHYSICIAN:  Donia Guiles, M.D.  DISCHARGE DIAGNOSES: 1. Ataxia. 2. Tremor. 3. History of alcohol abuse. 4. History of tobacco abuse. 5. History of atrial fibrillation. 6. History of seizure disorder. 7. History of cerebrovascular accident in November 2000. 8. History of ventricular tachycardia. 9. Possible Parkinsons disease.  DISCHARGE MEDICATIONS: 1. Requip 0.25 mg one tablet p.o. t.i.d. through July 23, 2000, Requip    0.5 mg one tablet p.o. t.i.d. beginning July 24, 2000. 2. Valproic acid 500 mg two tablets p.o. b.i.d. 3. Dilantin 100 mg one tablet p.o. b.i.d. 4. Enteric-coated aspirin 325 mg one tablet p.o. q.d.  PROCEDURES: 1. Magnetic resonance imaging of the head showing old left occipital and right    temporal infarcts as well as severe atrophy. 2. Two-dimensional echocardiogram, results pending at time of discharge. 3. Chest x-ray without active disease.  FOLLOW-UP:  The patient will follow up in the outpatient clinic on July 25, 2000, to check a Dilantin level.  The patient will follow up with his primary physician, Dr. Wyonia Hough, at a regularly scheduled appointment on January 21.  Also, the patient will follow up with Dr. Orlin Hilding at [facility] on Thursday, January 17 at 8:30 a.m.  CONSULTANTS:  Marlan Palau, M.D., at [facility].  HISTORY OF PRESENT ILLNESS:  The patient is a 77 year old African-American male with a history of alcohol abuse, tobacco abuse, hypertension, and a CVA in November 2000.  He presented to the clinic on November 30 with complaints of worsening ataxia  and right-sided weakness x 1 week.  The family brought him in because they felt they were unable to continue caring for him at home and desire possible rehab or nursing home placement.  The patient denied changes in vision or headaches.  They felt that this change was acute in onset and they occurred approximately one week before patient was admitted to the hospital.  The patient also has a history of a seizure disorder which is currently controlled on valproic acid and Dilantin.  PHYSICAL EXAMINATION:  VITAL SIGNS:  On admission, temperature 98.1, blood pressure 130/82, pulse 72, and respirations 20.  GENERAL:  This is an African-American gentleman appearing well nourished and lying on a stretcher in no acute distress.  He was slow to respond to questions but his speech was clear.  HEENT:  His pupils were equal, round, and reactive to light.  Tympanic membranes and oropharynx were clear.  He had no nystagmus.  HEART:  His heart rate was regular rate and rhythm with no murmurs, rubs, or gallops.  LUNGS:  Clear to auscultation bilaterally.  EXTREMITIES:  Exam showed 2+ edema bilaterally with 1+ pulses bilaterally and clubbing of the upper extremities bilaterally.  NEUROLOGIC:  Exam showed a fasting tremor with strength 5/5 bilaterally and sensation grossly intact.  ADMISSION LABORATORIES:  Significant for a Dilantin level of 24.6 mcg/ml with the reference range being 10 to 20 mcg/ml, and a valproic acid level of 79.0 mcg/ml which was within the normal limits, CBC showed a white count of 11.5,  hemoglobin 14.4, hematocrit 41.0, platelets 310.  BMP was nonspecific. Liver function tests were within normal limits.  HOSPITAL COURSE:  The patient was admitted to the hospital and an MRI was performed which showed the above results and indicated no acute disease. Additionally, the patients Dilantin level was held to allow it to fall to a level of approximately 15 mcg/ml.  A chest x-ray  was performed which showed no acute disease.  On the night of admission, the patient had sustained a run of ventricular tachycardia that lasted approximately 30 seconds.  He was asymptomatic during this time; however, due to concerns of possible cardiomyopathy, a two-dimensional echocardiogram was performed and these results will be followed up on.  Additionally, an EKG and cardiac enzymes were performed which were negative.  With the holding of the patients Dilantin, his ataxia appeared to improve and physical therapy and occupational therapy reported they were able to ambulate him.  Additionally, they felt that he was a possible rehabilitation candidate.  Because of concerns that the patients tremor was due to possible Parkinsons disease, neurology was consulted and they began him on Requip to see if his symptoms of tremor would improve. Neurology also felt that the patient was a potential rehab candidate on the day of discharge and the patient was without complaints.  His vital signs were stable and within normal limits.  Physical exam showed lungs clear to auscultation bilaterally, heart rate regular, no murmurs, rubs, or gallops, and neurologic exam showed continued tremor.  Labs at discharge showed a Dilantin 20.3 and a BMP of sodium 140, potassium 3.4, chloride 101, bicarbonate 30, BUN 9, creatinine 0.7, glucose 93, calcium 9.3.  We will continue holding the patients Dilantin dose at this time.  The patient is discharged to a rehab bed at [facility].  He will follow up with the above physicians. DD:  07/16/00 TD:  07/16/00 Job: [id number]

## 2011-01-01 ENCOUNTER — Ambulatory Visit (INDEPENDENT_AMBULATORY_CARE_PROVIDER_SITE_OTHER): Payer: Medicare Other | Admitting: Internal Medicine

## 2011-01-01 VITALS — BP 121/70 | HR 70 | Temp 97.8°F | Ht 70.0 in | Wt 198.3 lb

## 2011-01-01 DIAGNOSIS — E785 Hyperlipidemia, unspecified: Secondary | ICD-10-CM

## 2011-01-01 DIAGNOSIS — G20 Parkinson's disease: Secondary | ICD-10-CM

## 2011-01-01 DIAGNOSIS — C801 Malignant (primary) neoplasm, unspecified: Secondary | ICD-10-CM

## 2011-01-01 MED ORDER — PRAVASTATIN SODIUM 20 MG PO TABS: 20.0000 mg | ORAL_TABLET | Freq: Every day | ORAL | Status: DC

## 2011-01-01 NOTE — Patient Instructions (Signed)
Please take your medicines as prescribed. Please schedule a follow up appointment in 3 months or earlier if needed. 

## 2011-01-02 ENCOUNTER — Encounter: Payer: Self-pay | Admitting: Internal Medicine

## 2011-01-02 DIAGNOSIS — C801 Malignant (primary) neoplasm, unspecified: Secondary | ICD-10-CM | POA: Insufficient documentation

## 2011-01-02 NOTE — Assessment & Plan Note (Signed)
His last lipid panel from last year was reviewed. He should get another FLP that was offered to him, but he refused any blood draws with today's visit.  His prescription for pravastatin was refilled.

## 2011-01-02 NOTE — Progress Notes (Signed)
  Subjective:    Patient ID: Chase Berger, male    DOB: Jan 05, 1933, 77 y.o.   MRN: [id number]  HPI:77 y/o man with PMH significant for Parkisnonism, HLD, left mandibular sarcomatoid cancer comes to the clinic for a follow up visit.  Left mandibular scarcomatoid cancer treated in [place] in 2009 with radiation through 2010, and is in remission, he was last seen a week prior to this office visit at [facility] and informed that there are no signs of recurrence.  Patient denies any complaints but reports that sometimes he has pain at his surgery site for which he has been prescribed hydrocodone from [facility]. He was requesting for refills for his other medications.      Review of Systems: Denied fever, chills, fatigue, abdominal pain,nausea, vomiting , chest pain, SOB or palpiations.     Objective:   Physical Exam: General: alert, cooperative, in NAD, left side deformed from the surgery Lungs: Clear to auscultation bilaterally. CVS: RRR, S1, S 2 normal, no M/R/G. Abdomen: soft, non- tender, non distended, positive bowel sounds Extremities; no edema, clubbing or cyanosis. Neurology: AxOx3, cranial nerves II - XII intact, strength 5/5 bilaterally in all four extremities, senations intact to light touch.        Assessment & Plan:

## 2011-01-02 NOTE — Assessment & Plan Note (Signed)
Well controlled with current regimen. Will not make any changes,. Continue to monitor.

## 2011-01-02 NOTE — Assessment & Plan Note (Signed)
Left mandibular sarcomatoid tumour s/p surgical resection in 2009 f/b radiotherapy in 2010 at [facility]. He follows up at [facility] and his last appointment was a week prior to this clinic visit.

## 2011-01-23 ENCOUNTER — Other Ambulatory Visit: Payer: Self-pay | Admitting: *Deleted

## 2011-01-23 ENCOUNTER — Other Ambulatory Visit: Payer: Self-pay | Admitting: Internal Medicine

## 2011-01-24 MED ORDER — ASPIRIN-DIPYRIDAMOLE ER 25-200 MG PO CP12: 1.0000 | ORAL_CAPSULE | Freq: Two times a day (BID) | ORAL | Status: DC

## 2011-03-24 ENCOUNTER — Emergency Department (HOSPITAL_COMMUNITY): Payer: Medicare Other

## 2011-03-24 ENCOUNTER — Emergency Department (HOSPITAL_COMMUNITY)
Admission: EM | Admit: 2011-03-24 | Discharge: 2011-03-24 | Disposition: A | Discharge status: Home / Self Care | Payer: Medicare Other | Attending: Emergency Medicine | Admitting: Emergency Medicine

## 2011-03-24 DIAGNOSIS — Z85819 Personal history of malignant neoplasm of unspecified site of lip, oral cavity, and pharynx: Secondary | ICD-10-CM | POA: Insufficient documentation

## 2011-03-24 DIAGNOSIS — G40909 Epilepsy, unspecified, not intractable, without status epilepticus: Secondary | ICD-10-CM | POA: Insufficient documentation

## 2011-03-24 DIAGNOSIS — R296 Repeated falls: Secondary | ICD-10-CM | POA: Insufficient documentation

## 2011-03-24 DIAGNOSIS — M79609 Pain in unspecified limb: Secondary | ICD-10-CM | POA: Insufficient documentation

## 2011-03-24 DIAGNOSIS — IMO0002 Reserved for concepts with insufficient information to code with codable children: Secondary | ICD-10-CM | POA: Insufficient documentation

## 2011-03-24 DIAGNOSIS — S92919A Unspecified fracture of unspecified toe(s), initial encounter for closed fracture: Secondary | ICD-10-CM | POA: Insufficient documentation

## 2011-05-24 ENCOUNTER — Ambulatory Visit
Admission: RE | Admit: 2011-05-24 | Discharge: 2011-05-24 | Disposition: A | Discharge status: Home / Self Care | Payer: Medicare Other | Source: Ambulatory Visit | Attending: Radiation Oncology | Admitting: Radiation Oncology

## 2011-07-24 ENCOUNTER — Encounter: Payer: Medicare Other | Admitting: Internal Medicine

## 2011-07-24 ENCOUNTER — Other Ambulatory Visit: Payer: Self-pay | Admitting: Radiation Oncology

## 2011-07-24 DIAGNOSIS — R52 Pain, unspecified: Secondary | ICD-10-CM

## 2011-07-24 DIAGNOSIS — C411 Malignant neoplasm of mandible: Secondary | ICD-10-CM

## 2011-07-31 ENCOUNTER — Encounter: Payer: Self-pay | Admitting: Internal Medicine

## 2011-07-31 ENCOUNTER — Ambulatory Visit (INDEPENDENT_AMBULATORY_CARE_PROVIDER_SITE_OTHER): Payer: Medicare Other | Admitting: Internal Medicine

## 2011-07-31 VITALS — BP 137/73 | HR 57 | Temp 96.7°F | Wt 201.6 lb

## 2011-07-31 DIAGNOSIS — F0390 Unspecified dementia without behavioral disturbance: Secondary | ICD-10-CM

## 2011-07-31 DIAGNOSIS — J309 Allergic rhinitis, unspecified: Secondary | ICD-10-CM

## 2011-07-31 DIAGNOSIS — R569 Unspecified convulsions: Secondary | ICD-10-CM

## 2011-07-31 DIAGNOSIS — J302 Other seasonal allergic rhinitis: Secondary | ICD-10-CM

## 2011-07-31 DIAGNOSIS — E785 Hyperlipidemia, unspecified: Secondary | ICD-10-CM

## 2011-07-31 DIAGNOSIS — C801 Malignant (primary) neoplasm, unspecified: Secondary | ICD-10-CM

## 2011-07-31 LAB — LIPID PANEL
Cholesterol: 136 mg/dL (ref 0–200)
HDL: 37 mg/dL — ABNORMAL LOW (ref >39)
Total CHOL/HDL Ratio: 3.7 Ratio
Triglycerides: 85 mg/dL (ref <150)
VLDL: 17 mg/dL (ref 0–40)

## 2011-07-31 LAB — COMPREHENSIVE METABOLIC PANEL
AST: 15 U/L (ref 0–37)
Alkaline Phosphatase: 47 U/L (ref 39–117)
BUN: 7 mg/dL (ref 6–23)
Creat: 1.02 mg/dL (ref 0.50–1.35)
Glucose, Bld: 88 mg/dL (ref 70–99)

## 2011-07-31 MED ORDER — FLUTICASONE PROPIONATE 50 MCG/ACT NA SUSP: 2.0000 | Freq: Every day | NASAL | Status: DC

## 2011-07-31 MED ORDER — PRAVASTATIN SODIUM 40 MG PO TABS: 40.0000 mg | ORAL_TABLET | Freq: Every day | ORAL | Status: DC

## 2011-07-31 MED ORDER — LEVETIRACETAM 1000 MG PO TABS: 1000.0000 mg | ORAL_TABLET | Freq: Two times a day (BID) | ORAL | Status: DC

## 2011-07-31 MED ORDER — ROPINIROLE HCL 0.25 MG PO TABS: 0.2500 mg | ORAL_TABLET | Freq: Three times a day (TID) | ORAL | Status: DC

## 2011-07-31 NOTE — Assessment & Plan Note (Signed)
Prescribe Flonase, and reassess nasal congestion next followup

## 2011-07-31 NOTE — Assessment & Plan Note (Signed)
Patient's last LDL was over a year ago, at that time was 125, this was on pravastatin 20mg . I will recheck fasting lipid panel today along with a complete metabolic profile, and increased pravastatin to 40 mg for target LDL of 100 given his multiple comorbidities.

## 2011-07-31 NOTE — Assessment & Plan Note (Signed)
Left mandibular scarcomatoid cancer treated in [place] in 2009 with radiation through 2010, and is in remission, he followed up at [facility] last week, no sign of recurrence.

## 2011-07-31 NOTE — Patient Instructions (Signed)
Please start taking Flonase for nasal congestion, note that it may take up to 2 weeks to start working.

## 2011-07-31 NOTE — Progress Notes (Signed)
Subjective:     Patient ID: Chase Berger, male   DOB: Nov 19, 1932, 77 y.o.   MRN: [id number]  HPI  Patient is a 77 year old male with a past medical history listed below, presents to the outpatient clinic for routine followup, does not have any complaints today, would like medications refilled, reports that he's been having nasal congestion for the past several months and has not been taking Flonase but only Claritin without relief. He reports that he was at [facility] last week to followup his Left mandibular scarcomatoid cancer treated in [place] in 2009 with radiation through 2010, and is in remission, and that there are no signs of recurrence. No other complaints.  Patient Active Problem List  Diagnoses  . DYSLIPIDEMIA  . PARKINSON'S DISEASE  . CVA  . PERIPHERAL VASCULAR DISEASE  . ALLERGIC RHINITIS  . SEIZURE DISORDER  . FREQUENCY, URINARY  . IRON DEFICIENCY ANEMIA, HX OF  . Malignancy   Current Outpatient Prescriptions on File Prior to Visit  Medication Sig Dispense Refill  . dipyridamole-aspirin (AGGRENOX) 25-200 MG per 12 hr capsule Take 1 capsule by mouth 2 (two) times daily.  180 capsule  2  . docusate sodium (COLACE) 100 MG capsule Take 100 mg by mouth daily.        . ferrous sulfate 325 (65 FE) MG tablet Take 325 mg by mouth daily.        Marland Kitchen HYDROcodone-acetaminophen (LORTAB) 7.5-500 MG/15ML solution take 1 to 3 teaspoonful every 4 to 6 hours if needed for pain  473 mL  2   No Known Allergies   Review of Systems  All other systems reviewed and are negative.       Objective:   Physical Exam  Nursing note and vitals reviewed. Constitutional: He is oriented to person, place, and time. He appears well-developed and well-nourished.  HENT:  Head: Normocephalic and atraumatic.  Eyes: Pupils are equal, round, and reactive to light.  Neck: Normal range of motion. No JVD present. No thyromegaly present.  Cardiovascular: Normal rate, regular rhythm and normal  heart sounds.   Pulmonary/Chest: Effort normal and breath sounds normal. He has no wheezes. He has no rales.  Abdominal: Soft. Bowel sounds are normal. There is no tenderness. There is no rebound.  Musculoskeletal: Normal range of motion. He exhibits no edema.  Neurological: He is alert and oriented to person, place, and time.  Skin: Skin is warm and dry.

## 2011-08-15 ENCOUNTER — Telehealth: Payer: Self-pay | Admitting: *Deleted

## 2011-08-15 NOTE — Telephone Encounter (Signed)
Pt's spouse calls to ask about keppra and requip... She states the scripts she picked up recently were ordered by dr Gilford Rile and the neurologist usually orders them and they're all mixed up. i called the pharmacy... On 12/2 she picked up a refill of keppra w/ directions of 500mg  1 tablet in am and 2 tablets in pm and requip 1mg  3x daily. On 12/18 she picked up refills of keppra 1000mg  1 in am and 1 in pm, requip  0.25mg  1tab 3x daily. The 12/ 2 scripts were from [facility]. Please advise how pt should be taking

## 2011-08-16 ENCOUNTER — Other Ambulatory Visit: Payer: Self-pay | Admitting: Internal Medicine

## 2011-08-16 NOTE — Telephone Encounter (Signed)
I would advise patient to continue the medications as prescribed by neurology pending clarification by Dr. Gilford Rile.  Please also request a copy of patient's recent neurology office notes.

## 2011-08-17 ENCOUNTER — Other Ambulatory Visit: Payer: Self-pay | Admitting: Internal Medicine

## 2011-08-17 DIAGNOSIS — R569 Unspecified convulsions: Secondary | ICD-10-CM

## 2011-08-17 DIAGNOSIS — F0390 Unspecified dementia without behavioral disturbance: Secondary | ICD-10-CM

## 2011-08-17 MED ORDER — ROPINIROLE HCL 1 MG PO TABS: 1.0000 mg | ORAL_TABLET | Freq: Three times a day (TID) | ORAL | Status: DC

## 2011-08-17 MED ORDER — LEVETIRACETAM 1000 MG PO TABS: 500.0000 mg | ORAL_TABLET | Freq: Three times a day (TID) | ORAL | Status: DC

## 2011-08-17 NOTE — Telephone Encounter (Signed)
i have now spoken to [facility], the doctors at [facility] DO prescribe and refill pt's keppra and requip, i have ask for the last visit notes and his med list from them, i will have them scanned and put a copy in your mailbox (dr Gilford Rile) As stated in the first note [facility] had just filled the meds on 07/15/2011 and pt has plenty of refills, i will call pharm and cancel the scripts sent from clinic

## 2011-08-17 NOTE — Telephone Encounter (Signed)
This must have been an error on our part from an abstraction point of view. I have fixed the prescriptions to match what was given by [facility], and sent it to the pharmacy. For the 1000mg  keppra they picked up, these can be split and taken 1/2 tab in am and 1 tab in pm until they pick up the next script. As for the Requip they can take 4x 0.25mg  TID until they run out of these and can pick up the 1mg  strength from the pharmacy next. Thanks.

## 2011-09-11 ENCOUNTER — Encounter: Payer: Self-pay | Admitting: Gastroenterology

## 2011-09-12 ENCOUNTER — Encounter: Payer: Self-pay | Admitting: Gastroenterology

## 2011-09-19 ENCOUNTER — Encounter: Payer: Self-pay | Admitting: Gastroenterology

## 2011-10-01 ENCOUNTER — Encounter: Payer: Self-pay | Admitting: Gastroenterology

## 2011-10-01 ENCOUNTER — Ambulatory Visit (AMBULATORY_SURGERY_CENTER): Payer: Medicare Other

## 2011-10-01 VITALS — Ht 70.5 in | Wt 200.0 lb

## 2011-10-01 DIAGNOSIS — Z1211 Encounter for screening for malignant neoplasm of colon: Secondary | ICD-10-CM

## 2011-10-01 MED ORDER — PEG-KCL-NACL-NASULF-NA ASC-C 100 G PO SOLR: 1.0000 | Freq: Once | ORAL | Status: DC

## 2011-10-16 ENCOUNTER — Encounter: Payer: Self-pay | Admitting: Gastroenterology

## 2011-10-16 ENCOUNTER — Ambulatory Visit (AMBULATORY_SURGERY_CENTER): Payer: Medicare Other | Admitting: Gastroenterology

## 2011-10-16 VITALS — HR 64 | Temp 96.2°F | Resp 15 | Ht 70.0 in | Wt 200.0 lb

## 2011-10-16 DIAGNOSIS — D126 Benign neoplasm of colon, unspecified: Secondary | ICD-10-CM

## 2011-10-16 DIAGNOSIS — Z1211 Encounter for screening for malignant neoplasm of colon: Secondary | ICD-10-CM

## 2011-10-16 DIAGNOSIS — Z86010 Personal history of colonic polyps: Secondary | ICD-10-CM

## 2011-10-16 DIAGNOSIS — Z8500 Personal history of malignant neoplasm of unspecified digestive organ: Secondary | ICD-10-CM

## 2011-10-16 MED ORDER — SODIUM CHLORIDE 0.9 % IV SOLN: 500.0000 mL | INTRAVENOUS | Status: DC

## 2011-10-16 NOTE — Progress Notes (Signed)
Dr. Arlyce Dice advised that pt. Took aggrenox  Once on Sunday 10/14/11.  No orders given.

## 2011-10-16 NOTE — Op Note (Signed)
[facility] [address] [place], [place]  [address]  COLONOSCOPY PROCEDURE REPORT  PATIENT:  Chase Berger, Chase Berger  MR#:  [id number] BIRTHDATE:  [birth year], 78 yrs. old  GENDER:  male ENDOSCOPIST:  Barbette Hair. Arlyce Dice, MD REF. BY:  Marin Roberts, M.D. PROCEDURE DATE:  10/16/2011 PROCEDURE:  Colonoscopy with snare polypectomy, Colon with cold biopsy polypectomy ASA CLASS:  Class II INDICATIONS:  Screening, history of colon cancer, history of pre-cancerous (adenomatous) colon polyps Colon cancer 2008 MEDICATIONS:   MAC sedation, administered by CRNA propofol 70mg IV  DESCRIPTION OF PROCEDURE:   After the risks benefits and alternatives of the procedure were thoroughly explained, informed consent was obtained.  Digital rectal exam was performed and revealed no abnormalities.   The LB CF-[id number] endoscope was introduced through the anus and advanced to the ileum, without limitations.  The quality of the prep was good, using MoviPrep. The instrument was then slowly withdrawn as the colon was fully examined. <<PROCEDUREIMAGES>>  FINDINGS:  There were multiple polyps identified and removed (see image4 and image5). 4 sessile polyps 3-60mm - removed with cold polypectomy snare, submitted to pathology  A sessile polyp was found in the descending colon. It was 3 mm in size. Polyp was snared without cautery. Retrieval was successful (see image7). snare polyp  This was otherwise a normal examination of the colon (see image2, image3, and image8).   Retroflexed views in the rectum revealed no abnormalities.    The time to cecum =  1) 1.75 minutes. The scope was then withdrawn in  1) 12.0  minutes from the cecum and the procedure completed. COMPLICATIONS:  None ENDOSCOPIC IMPRESSION: 1) Polyps, multiple 2) 3 mm sessile polyp in the descending colon 3) Otherwise normal examination RECOMMENDATIONS: 1) If the polyp(s) removed today are proven to be adenomatous (pre-cancerous)  polyps, you will need a colonoscopy in 3 years. Otherwise you should continue to follow colorectal cancer screening guidelines for "routine risk" patients with a colonoscopy in 10 years. You will receive a letter within 1-2 weeks with the results of your biopsy as well as final recommendations. Please call my office if you have not received a letter after 3 weeks. REPEAT EXAM:   You will receive a letter from Dr. Arlyce Dice in 1-2 weeks, after reviewing the final pathology, with followup recommendations.  ______________________________ Barbette Hair Arlyce Dice, MD  CC:  n. eSIGNED:   Barbette Hair. Primo Innis at 10/16/2011 10:44 AM  Landis Gandy, [phone]

## 2011-10-16 NOTE — Patient Instructions (Addendum)
Resume aggrenox in 5 days.  YOU HAD AN ENDOSCOPIC PROCEDURE TODAY AT THE [facility]: Refer to the procedure report that was given to you for any specific questions about what was found during the examination.  If the procedure report does not answer your questions, please call your gastroenterologist to clarify.  If you requested that your care partner not be given the details of your procedure findings, then the procedure report has been included in a sealed envelope for you to review at your convenience later.  YOU SHOULD EXPECT: Some feelings of bloating in the abdomen. Passage of more gas than usual.  Walking can help get rid of the air that was put into your GI tract during the procedure and reduce the bloating. If you had a lower endoscopy (such as a colonoscopy or flexible sigmoidoscopy) you may notice spotting of blood in your stool or on the toilet paper. If you underwent a bowel prep for your procedure, then you may not have a normal bowel movement for a few days.  DIET: Your first meal following the procedure should be a light meal and then it is ok to progress to your normal diet.  A half-sandwich or bowl of soup is an example of a good first meal.  Heavy or fried foods are harder to digest and may make you feel nauseous or bloated.  Likewise meals heavy in dairy and vegetables can cause extra gas to form and this can also increase the bloating.  Drink plenty of fluids but you should avoid alcoholic beverages for 24 hours.  ACTIVITY: Your care partner should take you home directly after the procedure.  You should plan to take it easy, moving slowly for the rest of the day.  You can resume normal activity the day after the procedure however you should NOT DRIVE or use heavy machinery for 24 hours (because of the sedation medicines used during the test).    SYMPTOMS TO REPORT IMMEDIATELY: A gastroenterologist can be reached at any hour.  During normal business hours, 8:30 AM to  5:00 PM Monday through Friday, call [phone].  After hours and on weekends, please call the GI answering service at [phone] who will take a message and have the physician on call contact you.   Following lower endoscopy (colonoscopy or flexible sigmoidoscopy):  Excessive amounts of blood in the stool  Significant tenderness or worsening of abdominal pains  Swelling of the abdomen that is new, acute  Fever of 100F or higher  Following upper endoscopy (EGD)  Vomiting of blood or coffee ground material  New chest pain or pain under the shoulder blades  Painful or persistently difficult swallowing  New shortness of breath  Fever of 100F or higher  Black, tarry-looking stools  FOLLOW UP: If any biopsies were taken you will be contacted by phone or by letter within the next 1-3 weeks.  Call your gastroenterologist if you have not heard about the biopsies in 3 weeks.  Our staff will call the home number listed on your records the next business day following your procedure to check on you and address any questions or concerns that you may have at that time regarding the information given to you following your procedure. This is a courtesy call and so if there is no answer at the home number and we have not heard from you through the emergency physician on call, we will assume that you have returned to your regular daily activities without incident.  SIGNATURES/CONFIDENTIALITY: You and/or your care partner have signed paperwork which will be entered into your electronic medical record.  These signatures attest to the fact that that the information above on your After Visit Summary has been reviewed and is understood.  Full responsibility of the confidentiality of this discharge information lies with you and/or your care-partner.    INFORMATION ON POLYPS GIVEN TO YOU.

## 2011-10-16 NOTE — Progress Notes (Signed)
Patient did not experience any of the following events: a burn prior to discharge; a fall within the facility; wrong site/side/patient/procedure/implant event; or a hospital transfer or hospital admission upon discharge from the facility. (G8907) Patient did not have preoperative order for IV antibiotic SSI prophylaxis. (G8918)  

## 2011-10-17 ENCOUNTER — Telehealth: Payer: Self-pay | Admitting: *Deleted

## 2011-10-17 NOTE — Telephone Encounter (Signed)
  Follow up Call-  Call back number 10/16/2011  Post procedure Call Back phone  # [phone]  Permission to leave phone message Yes     Patient questions:  Do you have a fever, pain , or abdominal swelling? no Pain Score  0 *  Have you tolerated food without any problems? yes  Have you been able to return to your normal activities? yes  Do you have any questions about your discharge instructions: Diet   no Medications  no Follow up visit  no  Do you have questions or concerns about your Care? no  Actions: * If pain score is 4 or above: No action needed, pain <4.

## 2011-10-22 ENCOUNTER — Encounter: Payer: Self-pay | Admitting: Gastroenterology

## 2011-10-24 ENCOUNTER — Encounter: Payer: Self-pay | Admitting: Internal Medicine

## 2011-10-24 DIAGNOSIS — D369 Benign neoplasm, unspecified site: Secondary | ICD-10-CM | POA: Insufficient documentation

## 2011-11-22 ENCOUNTER — Ambulatory Visit
Admission: RE | Admit: 2011-11-22 | Discharge: 2011-11-22 | Disposition: A | Discharge status: Home / Self Care | Payer: Medicare Other | Source: Ambulatory Visit | Attending: Radiation Oncology | Admitting: Radiation Oncology

## 2011-11-22 ENCOUNTER — Encounter: Payer: Self-pay | Admitting: Radiation Oncology

## 2011-11-22 VITALS — BP 144/75 | HR 60 | Temp 97.0°F | Resp 20 | Wt 200.9 lb

## 2011-11-22 DIAGNOSIS — C801 Malignant (primary) neoplasm, unspecified: Secondary | ICD-10-CM

## 2011-11-22 NOTE — Progress Notes (Signed)
Pt here for f/u left mandible last radiation treatments= 04/12/2009-05/25/2009 Alert and oriented x3, stll has darkening skin left side of face, pt had oatmeal oj and coffee, eats chopped/pureed meats, mouth, litlle saliva still,  Last TSH done 12/23/10=[id number] c/o pain or discomfort 10:14 AM

## 2011-11-22 NOTE — Progress Notes (Signed)
  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  INTERVAL SINCE LAST RADIATION:  3 years.  NARRATIVE:  Mr. Kassim returns today for routine followup assessment.  He is essentially without complaint.  He has been following up with Dr. Hezzie Bump.  TSH level in May 2012 was normal.  PHYSICAL EXAMINATION:  General Appearance:  The patient is no acute distress today.  He is alert and oriented.  Vital Signs:  His weight is 199.6 pounds.  Temperature is 97 degrees.  Pulse 60.  Respiratory rate is 20.  Blood pressure 127/71.  Neck:  Hyperpigmented with no other notable skin discoloration.  There is no appreciable lymphadenopathy.  The patient does have a prominent thyroid, but no lymphadenopathy.  HEENT:  The left side of the mandible is surgically absent and the resection bed is soft with a palpable muscle flap.  There is no tenderness or distinct nodularity here to suggest recurrence.  The oral cavity is edentulous and there are no mucosal irregularities of concern within the directly visualized oral cavity or oropharynx.  Palpation of the floor of mouth and mandible and bimanual palpation of the cheek reveals no abnormalities.  ALLERGIES:   has no known allergies.  Meds: Current Outpatient Prescriptions  Medication Sig Dispense Refill  . AGGRENOX 25-200 MG per 12 hr capsule take 1 capsule by mouth twice a day  180 capsule  2  . docusate sodium (COLACE) 100 MG capsule Take 100 mg by mouth daily.        Marland Kitchen levETIRAcetam (KEPPRA) 1000 MG tablet Take 0.5 tablets (500 mg total) by mouth 3 (three) times daily.  90 tablet  11  . magnesium hydroxide (MILK OF MAGNESIA) 400 MG/5ML suspension Take by mouth daily as  needed. Capful as needed constipation      . pravastatin (PRAVACHOL) 20 MG tablet take 1 tablet by mouth once daily at bedtime  30 tablet  6  . rOPINIRole (REQUIP) 1 MG tablet Take 1 tablet (1 mg total) by mouth 3 (three) times daily.  90 tablet  11  . ferrous sulfate 325 (65 FE) MG tablet Take 325 mg by mouth daily.        . fluticasone (FLONASE) 50 MCG/ACT nasal spray Place 2 sprays into the nose daily.  16 g  5  . HYDROcodone-acetaminophen (LORTAB) 7.5-500 MG/15ML solution take 1 to 3 teaspoonful every 4 to 6 hours if needed for pain  473 mL  2  . pravastatin (PRAVACHOL) 40 MG tablet Take 1 tablet (40 mg total) by mouth daily.  30 tablet  11    Lab Findings: Lab Results  Component Value Date   WBC 4.8 10/28/2008   HGB 15.6 10/28/2008   HCT 45.2 10/28/2008   MCV 93.8 10/28/2008   PLT 249 10/28/2008    IMPRESSION:  Mr. Southwell exhibits no evidence to suggest recurrent disease.  PLAN:  The patient will return to the radiation oncology clinic for routine followup in 6 months.  _____________________________________  Artist Pais. Kathrynn Running, M.D.

## 2012-03-25 ENCOUNTER — Other Ambulatory Visit: Payer: Self-pay | Admitting: Internal Medicine

## 2012-04-15 DIAGNOSIS — H02239 Paralytic lagophthalmos unspecified eye, unspecified eyelid: Secondary | ICD-10-CM | POA: Insufficient documentation

## 2012-04-15 DIAGNOSIS — H02109 Unspecified ectropion of unspecified eye, unspecified eyelid: Secondary | ICD-10-CM | POA: Insufficient documentation

## 2012-05-20 ENCOUNTER — Encounter: Payer: Self-pay | Admitting: Radiation Oncology

## 2012-05-20 DIAGNOSIS — Z923 Personal history of irradiation: Secondary | ICD-10-CM | POA: Insufficient documentation

## 2012-05-22 ENCOUNTER — Ambulatory Visit: Payer: Medicare Other | Admitting: Radiation Oncology

## 2012-05-29 ENCOUNTER — Ambulatory Visit
Admission: RE | Admit: 2012-05-29 | Discharge: 2012-05-29 | Disposition: A | Discharge status: Home / Self Care | Payer: Medicare Other | Source: Ambulatory Visit | Attending: Radiation Oncology | Admitting: Radiation Oncology

## 2012-05-29 ENCOUNTER — Encounter: Payer: Self-pay | Admitting: Radiation Oncology

## 2012-05-29 VITALS — BP 120/68 | HR 62 | Temp 97.3°F | Resp 20 | Wt 196.7 lb

## 2012-05-29 DIAGNOSIS — T66XXXA Radiation sickness, unspecified, initial encounter: Secondary | ICD-10-CM

## 2012-05-29 DIAGNOSIS — C411 Malignant neoplasm of mandible: Secondary | ICD-10-CM | POA: Insufficient documentation

## 2012-05-29 DIAGNOSIS — Z923 Personal history of irradiation: Secondary | ICD-10-CM | POA: Insufficient documentation

## 2012-05-29 DIAGNOSIS — X58XXXA Exposure to other specified factors, initial encounter: Secondary | ICD-10-CM | POA: Insufficient documentation

## 2012-05-29 LAB — PSA: PSA: 2.01 ng/mL (ref <=4.00)

## 2012-05-29 NOTE — Progress Notes (Signed)
Pt denies pain, difficulty eating/swallowing, fatigue, loss of appetite. Eats soft foods w/o difficulty.

## 2012-05-29 NOTE — Patient Instructions (Signed)
Call if you have any questions.

## 2012-05-29 NOTE — Progress Notes (Signed)
  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 He is essentially without complaint. He has been following up with Dr. Hezzie Bump. TSH level in May 2012 was normal. Eye surgery planned on the 10/29.                              ALLERGIES:   has no known allergies.  Meds: Current Outpatient Prescriptions  Medication Sig Dispense Refill  . AGGRENOX 25-200 MG per 12 hr capsule take 1 capsule by mouth twice a day  180 capsule  2  . erythromycin ophthalmic ointment every 6 (six) hours. Apply to left eyelids twice a day for one week.      . levETIRAcetam (KEPPRA) 1000 MG tablet Take 0.5 tablets (500 mg total) by mouth 3 (three) times daily.  90 tablet  11  . magnesium hydroxide (MILK OF MAGNESIA) 400 MG/5ML suspension Take by mouth daily as needed. Capful as needed constipation      . magnesium hydroxide (MILK OF MAGNESIA) 400 MG/5ML suspension Take by mouth daily as needed.      Bertram Gala Glycol-Propyl Glycol (SYSTANE ULTRA) 0.4-0.3 % SOLN Apply to eye 3 times daily.      . pravastatin (PRAVACHOL) 20 MG tablet take 1 tablet by mouth once daily at bedtime  30 tablet  6  . rOPINIRole (REQUIP) 1 MG tablet Take 1 tablet (1 mg total) by mouth 3 (three) times daily.  90 tablet  11  . docusate sodium (COLACE) 100 MG capsule Take 100 mg by mouth daily.         Physical Findings: The patient is in no acute distress. Patient is alert and oriented.  weight is 196 lb 11.2 oz  (89.223 kg). His oral temperature is 97.3 F (36.3 C). His blood pressure is 120/68 and his pulse is 62. His respiration is 20. Marland Kitchen His speech remains slightly dysarthric but he is easily understandable. This is unchanged. He has no trismus. Within the oral cavity, there are no worrisome masses, mucosal lesions, or ulcerations. The flap appears to be well-seated. Neck exam is benign without any lymphadenopathy.  No significant changes.  Radiographic Findings: Neck and Chest CT at [facility] 6/13 showed no overt recurrence  Impression:  The patient has no evidence of recurrence  Plan:  Today, I will obtain TSH for post-radiation screening, and it appears he may benefit from PSA since his most recent levels through 2010 were rising.  He'll return for follow-up in 6 months  _____________________________________  Artist Pais. Kathrynn Running, M.D.

## 2012-05-30 ENCOUNTER — Telehealth: Payer: Self-pay | Admitting: *Deleted

## 2012-05-30 NOTE — Telephone Encounter (Signed)
Per Dr Kathrynn Running, spoke w/pt's wife and informed her pt 05/29/12 thyroid blood test is normal, PSA is slightly higher but still wnl so no problem. Pt's wife verbalized understanding.

## 2012-06-16 ENCOUNTER — Telehealth: Payer: Self-pay | Admitting: *Deleted

## 2012-06-16 NOTE — Telephone Encounter (Signed)
Pt scheduled  

## 2012-06-16 NOTE — Telephone Encounter (Signed)
Call from [place] at [facility]/Dr Anastasia Pall office.  Pt is scheduled for eye surgery next week and is on aggrenox. They will fax the form asking if pt can stop the med.

## 2012-06-16 NOTE — Telephone Encounter (Signed)
Pt was last seen 07/2011. This is Dr William Hamburger pt so he can override anything I saw. But since pt not seen in 11 months, he will need to make appt so that we can assess need of aggrenox. Would need long appt slot in [facility] since not seen in almost one year.

## 2012-06-17 ENCOUNTER — Ambulatory Visit (INDEPENDENT_AMBULATORY_CARE_PROVIDER_SITE_OTHER): Payer: Medicare Other | Admitting: Internal Medicine

## 2012-06-17 ENCOUNTER — Encounter: Payer: Self-pay | Admitting: Internal Medicine

## 2012-06-17 VITALS — BP 133/70 | HR 62 | Temp 97.5°F | Wt 197.0 lb

## 2012-06-17 DIAGNOSIS — I6350 Cerebral infarction due to unspecified occlusion or stenosis of unspecified cerebral artery: Secondary | ICD-10-CM

## 2012-06-17 DIAGNOSIS — R569 Unspecified convulsions: Secondary | ICD-10-CM

## 2012-06-17 DIAGNOSIS — E785 Hyperlipidemia, unspecified: Secondary | ICD-10-CM

## 2012-06-17 DIAGNOSIS — H02109 Unspecified ectropion of unspecified eye, unspecified eyelid: Secondary | ICD-10-CM

## 2012-06-17 LAB — COMPLETE METABOLIC PANEL WITH GFR
AST: 16 U/L (ref 0–37)
Alkaline Phosphatase: 42 U/L (ref 39–117)
BUN: 7 mg/dL (ref 6–23)
Calcium: 9.7 mg/dL (ref 8.4–10.5)
Chloride: 105 mEq/L (ref 96–112)
Creat: 0.97 mg/dL (ref 0.50–1.35)
GFR, Est Non African American: 76 mL/min
Glucose, Bld: 64 mg/dL — ABNORMAL LOW (ref 70–99)

## 2012-06-17 LAB — LIPID PANEL
Cholesterol: 143 mg/dL (ref 0–200)
Total CHOL/HDL Ratio: 3.4 Ratio
Triglycerides: 85 mg/dL (ref <150)
VLDL: 17 mg/dL (ref 0–40)

## 2012-06-17 NOTE — Patient Instructions (Addendum)
Please schedule a follow up appointment as needed. . Please bring your medication bottles with your next appointment. Please take your medicines as prescribed. I will call you with your lab results if anything will be abnormal. 

## 2012-06-17 NOTE — Assessment & Plan Note (Signed)
Stable on current meds. No recurrence

## 2012-06-17 NOTE — Progress Notes (Signed)
  Subjective:    Patient ID: Chase Berger, male    DOB: [birth year], 78 y.o.   MRN: [id number]  HPI: 78 year old man with past medical history significant for CVA ~2001, parkinsonism, well controlled hyperlipidemia presents to the clinic to get some paperwork filled out for his elective  ectropion repair surgery.  He was accompanied by his wife and daughter during this visit.  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 He follows up with Deretha [facility] and  is undergoing elective repair of his ectropion. His physician needed an approval to hold on Aggrenox for one week prior to the surgery scheduled on 06/17/12.   He denies any complaints per se. He is pretty independent and is able to take care of himself. His lites states that his Aggrenox was also held for some time before his tumor resection surgery.    Review of Systems  Constitutional: Negative for chills.  HENT: Negative for rhinorrhea and postnasal drip.   Respiratory: Negative for choking, chest tightness and shortness of breath.   Cardiovascular: Negative for chest pain and leg swelling.  Musculoskeletal: Negative for arthralgias.  Neurological: Negative for seizures, light-headedness and numbness. Facial asymmetry: s/p resection of sarcomastoid tumor.  Hematological: Negative for adenopathy.       Objective:   Physical Exam  Constitutional: He is oriented to person, place, and time. He appears well-developed and well-nourished.  HENT:  Head: Normocephalic and atraumatic.  Mouth/Throat: No oropharyngeal exudate.  Eyes: Conjunctivae normal are normal. Pupils are equal, round, and reactive to light.  Neck: Normal range of motion. Neck supple. No JVD present. No tracheal deviation present. No thyromegaly present.  Cardiovascular: Normal rate, regular rhythm and normal heart sounds.  Exam reveals no gallop and no friction rub.   No murmur heard. Pulmonary/Chest: Effort normal and breath  sounds normal. No stridor. No respiratory distress. He has no wheezes.  Abdominal: Soft. Bowel sounds are normal. He exhibits no distension. There is no tenderness. There is no rebound.  Neurological: He is alert and oriented to person, place, and time. He has normal reflexes. No cranial nerve deficit. Coordination normal.       Motor strength 5/5 in all  four extremities . Sensations intact to light touch.   Skin: Skin is warm.          Assessment & Plan:

## 2012-06-17 NOTE — Assessment & Plan Note (Addendum)
He is undergoing elective repair of his ectropion on the 06/23/2012 at [facility]. The physician at [facility] requested if we can approve him on holding his Aggrenox for a week prior to the procedure.  This patient is on Aggrenox for longtime, status post his CVA in 2001 with no recurrence. I think it is safe to hold his anti- platelet therapy for a week prior to surgery and would fax over the papers to [facility].  Though, according to the latest guidelines in up todate: The 2008 anticoagulation guidelines from the [employer] of [employer] Physicians (ACCP) recommend that patients who are receiving aspirin and are undergoing cataract removal continue to receive aspirin around the time of the procedure .The risks associated with either continuing or stopping aspirin appear to be very small. Individual patient characteristics may sway the decision to maintain aspirin. However there is very limited literature on the preoperative use of Aggrenox per se. Given the remote history of CVA no recurrence, good lipid control and no history of coronary artery disease, I think we can safely hold Aggrenox for a week. His case was discussed with Dr. Aundria Rud.

## 2012-06-17 NOTE — Assessment & Plan Note (Signed)
Lipid Panel     Component Value Date/Time   CHOL 136 07/31/2011 1117   TRIG 85 07/31/2011 1117   HDL 37* 07/31/2011 1117   CHOLHDL 3.7 07/31/2011 1117   VLDL 17 07/31/2011 1117   LDLCALC 82 07/31/2011 1117   Check lipid panel and CMET.

## 2012-08-29 ENCOUNTER — Other Ambulatory Visit: Payer: Self-pay | Admitting: Internal Medicine

## 2012-08-29 NOTE — Telephone Encounter (Signed)
Needs Feb appt with PCP. Thanks

## 2012-08-29 NOTE — Telephone Encounter (Signed)
Flaf sent to front desk pool for appt.

## 2012-10-29 ENCOUNTER — Other Ambulatory Visit: Payer: Self-pay | Admitting: *Deleted

## 2012-10-29 MED ORDER — PRAVASTATIN SODIUM 20 MG PO TABS: 20.0000 mg | ORAL_TABLET | Freq: Every day | ORAL | Status: DC

## 2012-11-27 ENCOUNTER — Ambulatory Visit: Admission: RE | Admit: 2012-11-27 | Payer: Medicare Other | Source: Ambulatory Visit | Admitting: Radiation Oncology

## 2012-12-25 ENCOUNTER — Encounter: Payer: Self-pay | Admitting: Radiation Oncology

## 2012-12-25 ENCOUNTER — Ambulatory Visit
Admission: RE | Admit: 2012-12-25 | Discharge: 2012-12-25 | Disposition: A | Discharge status: Home / Self Care | Payer: Medicare Other | Source: Ambulatory Visit | Attending: Radiation Oncology | Admitting: Radiation Oncology

## 2012-12-25 VITALS — BP 133/65 | HR 57 | Temp 97.0°F | Resp 18 | Wt 193.4 lb

## 2012-12-25 DIAGNOSIS — Z923 Personal history of irradiation: Secondary | ICD-10-CM

## 2012-12-25 DIAGNOSIS — C411 Malignant neoplasm of mandible: Secondary | ICD-10-CM

## 2012-12-25 NOTE — Progress Notes (Signed)
Patient presents to the clinic today accompanied by his wife and daughter for a follow up with Dr. Kathrynn Running. Patient alert and oriented to person, place, and time. No distress noted. Steady gait noted. Pleasant affect noted. Patient denies pain at this time. Patient reports great sensitivity to cold on the left side of his mouth. Patient denies painful or difficulty swallowing. Patient reports that he eats only soft foods. Patient reports a great appetite. Patient denies dry mouth. Patient reports that he maintains his weight. Patient denies ringing in the ears. Patient denies headache, dizziness, nausea or vomiting. Reported all findings to Dr. Kathrynn Running.

## 2012-12-25 NOTE — Progress Notes (Signed)
  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  He is essentially without complaint. He has been following up with Dr. Hezzie Bump. TSH level in Oct 2013 was normal.                               ALLERGIES:  has No Known Allergies.  Meds: Current Outpatient Prescriptions  Medication Sig Dispense Refill  . AGGRENOX 25-200 MG per 12 hr capsule take 1 capsule by mouth twice a day  180 capsule  2  . erythromycin ophthalmic ointment every 6 (six) hours. Apply to left eyelids twice a day for one week.      Marland Kitchen FLUZONE HIGH-DOSE SUSP Inject 1 application into the muscle once.      . Influenza Vac Split High-Dose (FLUZONE HIGH-DOSE) SUSP       . levETIRAcetam (KEPPRA) 1000 MG tablet Take 0.5 tablets (500 mg total) by mouth 3 (three) times daily.  90 tablet  11  . magnesium hydroxide (MILK OF MAGNESIA) 400 MG/5ML suspension Take by mouth daily as needed. Capful as needed constipation      . Polyethyl Glycol-Propyl Glycol (SYSTANE ULTRA) 0.4-0.3 % SOLN Apply to eye 3 times daily.      . pravastatin (PRAVACHOL) 20 MG tablet Take 1 tablet (20 mg total) by mouth daily.  30 tablet  11  . rOPINIRole (REQUIP) 1 MG tablet take 1 tablet by mouth three times a day  30 tablet  5  . docusate sodium (COLACE) 100 MG capsule Take 100 mg by mouth daily.         No current facility-administered medications for this encounter.    Physical Findings: The patient is in no acute  distress. Patient is alert and oriented.His speech remains slightly dysarthric but he is easily understandable. This is unchanged.  weight is 193 lb 6.4 oz (87.726 kg). His oral temperature is 97 F (36.1 C). His blood pressure is 133/65 and his pulse is 57. His respiration is 18 and oxygen saturation is 100%. . He has no trismus. Within the oral cavity, there are no worrisome masses, mucosal lesions, or ulcerations. The flap appears to be well-seated. Neck exam is benign without any lymphadenopathy.  No significant changes.  Impression:  The patient currently exhibits no evidence of recurrence.  Plan:  The patient will return for routine followup in 6 months.  _____________________________________  Artist Pais. Kathrynn Running, M.D.

## 2013-02-06 ENCOUNTER — Encounter: Payer: Self-pay | Admitting: Internal Medicine

## 2013-03-03 ENCOUNTER — Ambulatory Visit (INDEPENDENT_AMBULATORY_CARE_PROVIDER_SITE_OTHER): Payer: Medicare Other | Admitting: Internal Medicine

## 2013-03-03 ENCOUNTER — Encounter: Payer: Self-pay | Admitting: Internal Medicine

## 2013-03-03 VITALS — BP 115/67 | HR 69 | Temp 98.0°F | Ht 71.0 in | Wt 186.4 lb

## 2013-03-03 DIAGNOSIS — R569 Unspecified convulsions: Secondary | ICD-10-CM

## 2013-03-03 DIAGNOSIS — J309 Allergic rhinitis, unspecified: Secondary | ICD-10-CM

## 2013-03-03 MED ORDER — CETIRIZINE HCL 5 MG PO TABS: 5.0000 mg | ORAL_TABLET | Freq: Every day | ORAL | Status: DC

## 2013-03-03 NOTE — Assessment & Plan Note (Signed)
Seizures well controlled, on Keppra 1000 mg. Last seizure ~3 months ago, follows with neurology.

## 2013-03-03 NOTE — Progress Notes (Signed)
Patient ID: Chase Berger, male   DOB: 01-11-1933, 79 y.o.   MRN: [id number]    Subjective:   Patient ID: Chase Berger male   DOB: [birth year] 79 y.o.   MRN: [id number]  HPI: [name] is a very pleasant 79 y.o. y/o male w/ PMHx of sarcomatoid ca. of the left mandible (resected in 2009, in remission), CVA (2001), HLD, PVD, Seizure disorder, Parkinson's, and allergic rhinitis comes to clinic today for a general check-up. He comes to the clinic with his wife today and his only complaints was ongoing allergies. He said he takes Claritin, but it provides no relief. He has runny nose, watery eyes, and a very minor cough. His wife also had some concern that his urine has been darker than normal. She says he drinks a lot of sodas and that his urine has been slightly dark for the past month or so, but may be due to the warm weather.He denies any symptoms of dysuria, hesitancy, frequency, CVA tenderness, hematuria, fever, chills, nausea, vomiting, or abdominal pain.  He takes Keppra for his seizure disorder and it keeps his seizures under control. He has ~1 per year, the last about 3 months ago. Otherwise no issues.  He follows with hem/onc regularly for his past history of sarcomatoid carcinoma, the last visit in May and no recurrence of disease was noted.   Past Medical History  Diagnosis Date  . Malignant neoplasm of intestinal tract, part unspecified 09/17/2006    left mandibular sarcomatoid cancer treated in [place] in 2009 with radiation through 2010; last seen 06/2010 with no signs of deformity or progression  . 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 Allergic rhinitis 09/16/2008  . Hyperlipidemia 09/01/2008  . Urinary frequency 08/30/2008  . H/O: iron deficiency anemia 08/30/2008  . Malignant neoplasm of mandible 07/07/2008    left  . CVA (cerebral infarction) 06/30/2007  . Parkinson disease 06/30/2007  . Smoker 06/24/2006  . Seizure disorder 06/24/2006  . Peripheral vascular  disease 06/24/2006  . Stroke   . Hx of radiation therapy 04/12/09 -05/25/09    left infratemporal fossa   Current Outpatient Prescriptions  Medication Sig Dispense Refill  . AGGRENOX 25-200 MG per 12 hr capsule take 1 capsule by mouth twice a day  180 capsule  2  . docusate sodium (COLACE) 100 MG capsule Take 100 mg by mouth daily.        Marland Kitchen erythromycin ophthalmic ointment every 6 (six) hours. Apply to left eyelids twice a day for one week.      Marland Kitchen FLUZONE HIGH-DOSE SUSP Inject 1 application into the muscle once.      . Influenza Vac Split High-Dose (FLUZONE HIGH-DOSE) SUSP       . levETIRAcetam (KEPPRA) 1000 MG tablet Take 0.5 tablets (500 mg total) by mouth 3 (three) times daily.  90 tablet  11  . magnesium hydroxide (MILK OF MAGNESIA) 400 MG/5ML suspension Take by mouth daily as needed. Capful as needed constipation      . Polyethyl Glycol-Propyl Glycol (SYSTANE ULTRA) 0.4-0.3 % SOLN Apply to eye 3 times daily.      . pravastatin (PRAVACHOL) 20 MG tablet Take 1 tablet (20 mg total) by mouth daily.  30 tablet  11  . rOPINIRole (REQUIP) 1 MG tablet take 1 tablet by mouth three times a day  30 tablet  5   No current facility-administered medications for this visit.   Family History  Problem Relation Age of Onset  .  Heart disease Mother   . Heart disease Father   . Colon cancer Neg Hx    History   Social History  . Marital Status: Married    Spouse Name: N/A    Number of Children: N/A  . Years of Education: N/A   Social History Main Topics  . Smoking status: Former Smoker -- 50 years    Types: Cigarettes    Quit date: 11/12/2007  . Smokeless tobacco: Former Neurosurgeon    Quit date: 10/01/1999  . Alcohol Use: No  . Drug Use: No  . Sexually Active: None   Other Topics Concern  . None   Social History Narrative  . None   Review of Systems: General: Denies fever, chills, diaphoresis, appetite change and fatigue.  HEENT: Positive for watery eyes and redness, rhinorrhea. Cannot  hear well out of his right ear, but not a recent occurrence. Denies change in vision, eye pain, congestion, sore throat, sneezing, mouth sores, trouble swallowing, neck pain, neck stiffness and tinnitus.   Respiratory: Positive for intermittent dry cough, associated w/ his allergies. Denies SOB, DOE, chest tightness, and wheezing.   Cardiovascular: Denies chest pain, palpitations and leg swelling.  Gastrointestinal: History of chronic constipation, controlled with medications. Denies nausea, vomiting, abdominal pain, diarrhea, blood in stool and abdominal distention.  Genitourinary: Positive for dark colored urine. Denies dysuria, urgency, frequency, hematuria, flank pain and difficulty urinating.  Endocrine: Denies hot or cold intolerance, sweats, polyuria, polydipsia. Musculoskeletal: History of Parkinson's, w/ tremor and minor gait abnormality. Denies myalgias, back pain, joint swelling, arthralgias. Skin: Denies pallor, rash and wounds.  Neurological: History of seizure disorder, last seizure 3 months ago. Denies dizziness, syncope, weakness, lightheadedness, numbness and headaches.  Hematological: Denies adenopathy,easy bruising, personal or family bleeding history.  Psychiatric/Behavioral: Denies mood changes, confusion, nervousness, sleep disturbance and agitation.  Objective:  Physical Exam: Filed Vitals:   03/03/13 1458  BP: 115/67  Pulse: 69  Temp: 98 F (36.7 C)  TempSrc: Oral  Height: 5\' 11"  (1.803 m)  Weight: 186 lb 6.4 oz (84.55 kg)  SpO2: 100%  General: Vital signs reviewed.  Patient is a well-developed and well-nourished, in no acute distress and cooperative with exam. Alert and oriented x3.  Head: Normocephalic and atraumatic. Nose: No erythema or drainage noted.  Turbinates normal. Mouth: No erythema, exudates, sores, or ulcerations. Moist mucus membranes. Facial asymmetry s/p L. madibular resection in '09. Eyes: PERRL, EOMI, conjunctivae normal, No scleral icterus.    Neck: Supple, trachea midline, normal ROM, No JVD, masses, thyromegaly, or carotid bruit present.  Cardiovascular: RRR, S1 normal, S2 normal, no murmurs, gallops, or rubs. Pulmonary/Chest: normal respiratory effort, CTAB, no wheezes, rales, or rhonchi. Abdominal: Soft. Non-tender, non-distended, bowel sounds are normal, no masses, organomegaly, or guarding present.  GU: No CVA tenderness. Musculoskeletal: No joint deformities, erythema, or stiffness, ROM full and nontender. Extremities: Trace edema in LE's.  pulses symmetric and intact bilaterally. No cyanosis or clubbing. Neurological: A&O x3. Resting tremor, more prominent on right side. Strength is normal and symmetric bilaterally, cranial nerve II-XII are grossly intact, no focal motor deficit, sensory intact to light touch bilaterally.  Skin: Warm, dry and intact. No rashes or erythema. Psychiatric: Normal mood and affect. speech and behavior is normal. Cognition and memory are normal.   Assessment & Plan:  Please see problem based assessment and plan.

## 2013-03-03 NOTE — Assessment & Plan Note (Signed)
Complains of watery eyes and rhinorrhea today. Gave prescription for Zyrtec 5 mg qd.

## 2013-03-03 NOTE — Patient Instructions (Addendum)
Make anther clinic appointment in a year. Start to take Zyrtec (cetirizine) 5 mg daily, or as symptoms arise for your allergies. If urine becomes significantly darker, contains, blood, clots, or you have pain with urination, please come see [place].  Nice to meet you!

## 2013-03-04 NOTE — Progress Notes (Signed)
I saw and evaluated the patient.  I personally confirmed the key portions of the history and exam documented by Dr. Yetta Barre and I reviewed pertinent patient test results.  The assessment, diagnosis, and plan were formulated together and I agree with the documentation in the resident's note. The pt reported that he did not want to use a steroid nasal spray so Dr Yetta Barre Rx'd a different oral allergy tx.

## 2013-04-27 ENCOUNTER — Ambulatory Visit (INDEPENDENT_AMBULATORY_CARE_PROVIDER_SITE_OTHER): Payer: Medicare Other | Admitting: Nurse Practitioner

## 2013-04-27 ENCOUNTER — Encounter: Payer: Self-pay | Admitting: Nurse Practitioner

## 2013-04-27 VITALS — BP 131/73 | HR 56 | Ht 72.0 in | Wt 184.5 lb

## 2013-04-27 DIAGNOSIS — R569 Unspecified convulsions: Secondary | ICD-10-CM

## 2013-04-27 DIAGNOSIS — I6350 Cerebral infarction due to unspecified occlusion or stenosis of unspecified cerebral artery: Secondary | ICD-10-CM

## 2013-04-27 DIAGNOSIS — G20 Parkinson's disease: Secondary | ICD-10-CM

## 2013-04-27 NOTE — Patient Instructions (Addendum)
Continue Keppra at current dose, call for any seizure activity Continue Aggrenox for secondary stroke prevention Continue Requip for mild Parkinson's disease tremor stable Return 6 to 8 months and when necessary

## 2013-04-27 NOTE — Progress Notes (Signed)
[facility]  PATIENT: Chase Berger DOB: Sep 05, 1932   REASON FOR VISIT: Followup for seizure disorder, Parkinson's disease, and history of remote stroke   HISTORY OF PRESENT ILLNESS:Chase Berger,  79 year old black male returns for followup., accompanied by his wife and daughter.  He was last seen 02/12/12.     He has a history of remote stroke,  Parkinson's disease, and seizure disorder.He is currently on Keppra which has been switched from Brand to generic since last seen. He has had break through seizures on generic in the past but denies any breakthrough since last seen.   He is on Aggrenox twice a day for stroke prevention, and Requip for his minor Parkinson's symptoms. No seizure activity since  February 2010.  He had sarcomatoid carcinoma  of the left mandible and had surgical resection and flap reconstruction in 2009.   He is doing well, independent in daily living. Remains on soft foods and weight is stable. No new neurological complaints.     REVIEW OF SYSTEMS: Full 14 system review of systems performed and notable only for:  Constitutional: N/A  Cardiovascular: N/A  Ear/Nose/Throat: N/A  Skin: N/A  Eyes: N/A  Respiratory: N/A  Gastroitestinal: N/A  Hematology/Lymphatic: N/A  Endocrine: N/A Musculoskeletal:N/A  Allergy/Immunology: N/A  Neurological: N/A Psychiatric: N/A   ALLERGIES: No Known Allergies  HOME MEDICATIONS: Outpatient Prescriptions Prior to Visit  Medication Sig Dispense Refill  . AGGRENOX 25-200 MG per 12 hr capsule take 1 capsule by mouth twice a day  180 capsule  2  . docusate sodium (COLACE) 100 MG capsule Take 100 mg by mouth daily.        Marland Kitchen erythromycin ophthalmic ointment every 6 (six) hours. Apply to left eyelids twice a day for one week.      Marland Kitchen FLUZONE HIGH-DOSE SUSP Inject 1 application into the muscle once.      . Influenza Vac Split High-Dose (FLUZONE HIGH-DOSE) SUSP       . levETIRAcetam (KEPPRA) 1000 MG tablet Take  0.5 tablets (500 mg total) by mouth 3 (three) times daily.  90 tablet  11  . magnesium hydroxide (MILK OF MAGNESIA) 400 MG/5ML suspension Take by mouth daily as needed. Capful as needed constipation      . Polyethyl Glycol-Propyl Glycol (SYSTANE ULTRA) 0.4-0.3 % SOLN Apply to eye 3 times daily.      . pravastatin (PRAVACHOL) 20 MG tablet Take 1 tablet (20 mg total) by mouth daily.  30 tablet  11  . rOPINIRole (REQUIP) 1 MG tablet take 1 tablet by mouth three times a day  30 tablet  5  . cetirizine (ZYRTEC) 5 MG tablet Take 1 tablet (5 mg total) by mouth daily.  30 tablet  11   No facility-administered medications prior to visit.    PAST MEDICAL HISTORY: Past Medical History  Diagnosis Date  . Malignant neoplasm of intestinal tract, part unspecified 09/17/2006    left mandibular sarcomatoid cancer treated in [place] in 2009 with radiation through 2010; last seen 06/2010 with no signs of deformity or progression  . 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 Allergic rhinitis 09/16/2008  . Hyperlipidemia 09/01/2008  . Urinary frequency 08/30/2008  . H/O: iron deficiency anemia 08/30/2008  . Malignant neoplasm of mandible 07/07/2008    left  . CVA (cerebral infarction) 06/30/2007  . Parkinson disease 06/30/2007  . Smoker 06/24/2006  . Seizure disorder 06/24/2006  . Peripheral vascular disease 06/24/2006  . Stroke   . Hx of radiation  therapy 04/12/09 -05/25/09    left infratemporal fossa    PAST SURGICAL HISTORY: Past Surgical History  Procedure Laterality Date  . Cancer removed from jaw      FAMILY HISTORY: Family History  Problem Relation Age of Onset  . Heart disease Mother   . Heart attack Mother   . Heart disease Father   . Heart attack Father   . Colon cancer Neg Hx     SOCIAL HISTORY: History   Social History  . Marital Status: Married    Spouse Name: [name]    Number of Children: 6  . Years of Education: HS   Occupational History  . Retired    Social History  Main Topics  . Smoking status: Former Smoker -- 50 years    Types: Cigarettes    Quit date: 11/12/2007  . Smokeless tobacco: Former Neurosurgeon    Quit date: 10/01/1999  . Alcohol Use: No  . Drug Use: No  . Sexual Activity: Not on file   Other Topics Concern  . Not on file   Social History Narrative   Patient lives at home with spouse.   Caffeine Use: 2 cups of coffee; 2-3 sodas daily     PHYSICAL EXAM  Filed Vitals:   04/27/13 1450  BP: 131/73  Pulse: 56  Height: 6' (1.829 m)  Weight: 184 lb 8 oz (83.689 kg)   Body mass index is 25.02 kg/(m^2).  Generalized: Well developed, in no acute distress  Head: Distorted left mandibular and cheek region due to surgical resection  Oropharynx benign  Neck: Supple, no carotid bruits  Cardiac: Regular rate rhythm, no murmur  Musculoskeletal: No deformity   Neurological examination   Mentation: Alert oriented to time, place, history taking. Follows all commands, dysarthria Cranial nerve II-XII: Pupils were equal round reactive to light extraocular movements were full, visual field were full on confrontational test. 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 hearing was intact to finger rubbing bilaterally. Uvula tongue midline. head turning and shoulder shrug and were normal and symmetric.Tongue protrusion into cheek strength was normal. Motor: normal bulk and tone, full strength in the BUE, BLE, fine finger movements normal, no pronator drift. No focal weakness, no cogwheeling, no resting tremor Sensory: normal and symmetric to light touch, pinprick, and  vibration  Coordination: finger-nose-finger, heel-to-shin bilaterally, no dysmetria Reflexes: Brachioradialis 2/2, biceps 2/2, triceps 2/2, patellar 2/2, Achilles 2/2, plantar responses were flexor bilaterally. Gait and Station: Rising up from seated position without assistance, normal stance,  moderate  stride, good arm swing, smooth turning, able to perform tiptoe, and heel walking without difficulty.   DIAGNOSTIC DATA (LABS, IMAGING, TESTING) -None to review   ASSESSMENT AND PLAN  79 y.o. year old male  has a past medical history of Malignant neoplasm of intestinal tract, part unspecified (09/17/2006); Tubulovillous adenoma of colon (09/17/2006); Allergic rhinitis (09/16/2008); Hyperlipidemia (09/01/2008); Urinary frequency (08/30/2008); H/O: iron deficiency anemia (08/30/2008); Malignant neoplasm of mandible (07/07/2008); CVA (cerebral infarction) (06/30/2007); Parkinson disease (06/30/2007); Smoker (06/24/2006); Seizure disorder (06/24/2006); Peripheral vascular disease (06/24/2006); Stroke; and radiation therapy (04/12/09 -05/25/09). here for followup. No further stroke or TIA symptoms. Parkinson symptoms are stable. No seizures in several years   Continue Keppra at current dose, call for any seizure activity Continue Aggrenox for secondary stroke prevention Continue Requip for mild Parkinson's disease,  tremor stable Return 6 to 8 months and when necessary   [place]  Karma GanjaMartin, GNP, [facility], APRN  [facility] [address], [address] [place], [place] [address] [phone]

## 2013-06-01 ENCOUNTER — Telehealth: Payer: Self-pay | Admitting: Nurse Practitioner

## 2013-06-01 DIAGNOSIS — R569 Unspecified convulsions: Secondary | ICD-10-CM

## 2013-06-01 MED ORDER — LEVETIRACETAM 500 MG PO TABS: ORAL_TABLET | ORAL | Status: DC

## 2013-06-01 MED ORDER — ROPINIROLE HCL 1 MG PO TABS: 1.0000 mg | ORAL_TABLET | Freq: Three times a day (TID) | ORAL | Status: DC

## 2013-06-01 MED ORDER — ASPIRIN-DIPYRIDAMOLE ER 25-200 MG PO CP12: 1.0000 | ORAL_CAPSULE | Freq: Two times a day (BID) | ORAL | Status: DC

## 2013-06-01 NOTE — Telephone Encounter (Signed)
Patient was just in for OV last month.  I sent refills on all 3 Rx's we prescribe.

## 2013-06-17 ENCOUNTER — Ambulatory Visit
Admission: RE | Admit: 2013-06-17 | Discharge: 2013-06-17 | Disposition: A | Discharge status: Home / Self Care | Payer: Medicare Other | Source: Ambulatory Visit | Attending: Radiation Oncology | Admitting: Radiation Oncology

## 2013-06-17 ENCOUNTER — Encounter: Payer: Self-pay | Admitting: Radiation Oncology

## 2013-06-17 VITALS — BP 144/69 | HR 75 | Temp 97.7°F | Resp 18 | Wt 186.0 lb

## 2013-06-17 DIAGNOSIS — Y842 Radiological procedure and radiotherapy as the cause of abnormal reaction of the patient, or of later complication, without mention of misadventure at the time of the procedure: Secondary | ICD-10-CM | POA: Insufficient documentation

## 2013-06-17 DIAGNOSIS — T66XXXA Radiation sickness, unspecified, initial encounter: Secondary | ICD-10-CM | POA: Insufficient documentation

## 2013-06-17 DIAGNOSIS — R634 Abnormal weight loss: Secondary | ICD-10-CM | POA: Insufficient documentation

## 2013-06-17 DIAGNOSIS — Z85819 Personal history of malignant neoplasm of unspecified site of lip, oral cavity, and pharynx: Secondary | ICD-10-CM | POA: Insufficient documentation

## 2013-06-17 DIAGNOSIS — Z79899 Other long term (current) drug therapy: Secondary | ICD-10-CM | POA: Insufficient documentation

## 2013-06-17 NOTE — Progress Notes (Signed)
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  TSH level last drawn October 2013. Wife confirms patient's PCP nor neurologist drew a TSH. Seven pound weight loss noted since May. Wife reports cutting patient back on sweets. No sores or ulcerations of mouth noted. No lymphedema of neck noted. Reports thick rope like saliva. Reports eating and swallowing without difficulty. Denies fatigue and pain.  Denies otalgia.                              ALLERGIES:  has No Known Allergies.  Meds: Current Outpatient Prescriptions  Medication Sig Dispense Refill  . cetirizine (ZYRTEC) 10 MG tablet Take 10 mg by mouth as needed for allergies.      Marland Kitchen dipyridamole-aspirin (AGGRENOX) 200-25 MG per 12 hr capsule Take 1 capsule by mouth 2 (two) times daily.  60 capsule  6  . docusate sodium (COLACE) 100 MG capsule Take 100 mg by mouth daily.        Marland Kitchen erythromycin ophthalmic ointment every 6 (six) hours. Apply to left eyelids twice a day for one week.      Marland Kitchen FLUZONE HIGH-DOSE SUSP Inject 1 application into the muscle once.      . Influenza Vac Split High-Dose (FLUZONE HIGH-DOSE) SUSP       . levETIRAcetam (KEPPRA) 500 MG tablet One tablet po qam and two tablets po qhs- DAW-1  90 tablet  6  . loratadine (CLARITIN) 10 MG tablet Take 10 mg by mouth daily as needed for allergies.      . magnesium hydroxide (MILK OF MAGNESIA) 400 MG/5ML suspension Take by mouth daily as needed. Capful as  needed constipation      . ofloxacin (OCUFLOX) 0.3 % ophthalmic solution       . Polyethyl Glycol-Propyl Glycol (SYSTANE ULTRA) 0.4-0.3 % SOLN Apply to eye 3 times daily.      . pravastatin (PRAVACHOL) 20 MG tablet Take 1 tablet (20 mg total) by mouth daily.  30 tablet  11  . prednisoLONE acetate (PRED FORTE) 1 % ophthalmic suspension       . rOPINIRole (REQUIP) 1 MG tablet Take 1 tablet (1 mg total) by mouth 3 (three) times daily.  90 tablet  6   No current facility-administered medications for this encounter.    Physical Findings: The patient is in no acute distress. Patient is alert and oriented.  weight is 186 lb (84.369 kg). His oral temperature is 97.7 F (36.5 C). His blood pressure is 144/69 and his pulse is 75. His respiration is 18 and oxygen saturation is 95%. .  He has no trismus. Within the oral cavity, there are no worrisome masses, mucosal lesions, or ulcerations. The flap appears to be well-seated. Neck exam is benign without any lymphadenopathy. No significant changes.  Impression:  The patient is recovering from the effects of radiation.  He has no  evidence of disease.  He needs annual TSH.  Plan:  Check TSH today, follow-up in 6 months.  _____________________________________  Artist Pais. Kathrynn Running, M.D.

## 2013-06-17 NOTE — Progress Notes (Signed)
TSH level last drawn October 2013. Wife confirms patient's PCP nor neurologist drew a TSH. Seven pound weight loss noted since May. Wife reports cutting patient back on sweets. No sores or ulcerations of mouth noted. No lymphedema of neck noted. Wife concerned about patients bad breath x1 month. Reports thick rope like saliva. Reports eating and swallowing without difficulty. Denies fatigue and pain.

## 2013-06-17 NOTE — Addendum Note (Signed)
Encounter addended by: Oneita Hurt, MD on: 06/17/2013  7:35 PM<BR>     Documentation filed: Visit Diagnoses, Clinical Notes, Notes Section

## 2013-06-18 ENCOUNTER — Ambulatory Visit: Payer: Medicare Other | Admitting: Radiation Oncology

## 2013-06-18 NOTE — Progress Notes (Signed)
Quick Note:  Please call patient with normal result.  Thanks. MM ______ 

## 2013-06-22 ENCOUNTER — Telehealth: Payer: Self-pay | Admitting: Radiation Oncology

## 2013-06-22 NOTE — Telephone Encounter (Signed)
Phoned patient at home. Spoke with his wife [name]. Per Dr. Broadus John order relayed TSH of 1.208 which is normal. She verbalized understanding and expressed appreciation for the call.

## 2013-06-25 ENCOUNTER — Ambulatory Visit: Payer: Medicare Other | Admitting: Radiation Oncology

## 2013-10-26 ENCOUNTER — Other Ambulatory Visit: Payer: Self-pay | Admitting: *Deleted

## 2013-10-27 MED ORDER — PRAVASTATIN SODIUM 20 MG PO TABS: 20.0000 mg | ORAL_TABLET | Freq: Every day | ORAL | Status: DC

## 2013-11-16 ENCOUNTER — Encounter: Payer: Medicare Other | Admitting: Internal Medicine

## 2013-11-23 ENCOUNTER — Encounter: Payer: Self-pay | Admitting: Internal Medicine

## 2013-11-23 ENCOUNTER — Ambulatory Visit (INDEPENDENT_AMBULATORY_CARE_PROVIDER_SITE_OTHER): Payer: Medicare Other | Admitting: Internal Medicine

## 2013-11-23 VITALS — BP 121/70 | HR 52 | Temp 96.9°F | Ht 72.0 in | Wt 184.7 lb

## 2013-11-23 DIAGNOSIS — J309 Allergic rhinitis, unspecified: Secondary | ICD-10-CM

## 2013-11-23 DIAGNOSIS — R569 Unspecified convulsions: Secondary | ICD-10-CM

## 2013-11-23 DIAGNOSIS — E785 Hyperlipidemia, unspecified: Secondary | ICD-10-CM

## 2013-11-23 LAB — CBC
HCT: 41.2 % (ref 39.0–52.0)
Hemoglobin: 13.8 g/dL (ref 13.0–17.0)
MCH: 31.7 pg (ref 26.0–34.0)
MCHC: 33.5 g/dL (ref 30.0–36.0)
MCV: 94.5 fL (ref 78.0–100.0)
Platelets: 256 10*3/uL (ref 150–400)
RBC: 4.36 MIL/uL (ref 4.22–5.81)
RDW: 14 % (ref 11.5–15.5)
WBC: 5.1 10*3/uL (ref 4.0–10.5)

## 2013-11-23 LAB — LIPID PANEL
Cholesterol: 133 mg/dL (ref 0–200)
HDL: 42 mg/dL (ref >39)
LDL CALC: 78 mg/dL (ref 0–99)
TRIGLYCERIDES: 67 mg/dL (ref <150)
Total CHOL/HDL Ratio: 3.2 Ratio
VLDL: 13 mg/dL (ref 0–40)

## 2013-11-23 LAB — BASIC METABOLIC PANEL WITH GFR
BUN: 8 mg/dL (ref 6–23)
CALCIUM: 9.9 mg/dL (ref 8.4–10.5)
CO2: 30 mEq/L (ref 19–32)
Chloride: 102 mEq/L (ref 96–112)
Creat: 0.91 mg/dL (ref 0.50–1.35)
GFR, EST NON AFRICAN AMERICAN: 80 mL/min
GLUCOSE: 75 mg/dL (ref 70–99)
POTASSIUM: 4.5 meq/L (ref 3.5–5.3)
SODIUM: 141 meq/L (ref 135–145)

## 2013-11-23 NOTE — Progress Notes (Signed)
Patient ID: Chase Berger, male   DOB: 02-09-1933, 80 y.o.   MRN: [id number]    Subjective:   Patient ID: Chase Berger male   DOB: 03-05-1933 80 y.o.   MRN: [id number]  HPI: [name] is a very pleasant 80 y.o. 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 He comes to the clinic with his daughter today and has no complaints besides mild allergies which he says are well controlled. No recent seizures, pain, dysuria, hesitancy, frequency, CVA tenderness, hematuria, fever, chills, nausea, vomiting, or abdominal pain.  Patient recently followed w/ rad/onc, no recent issues, TSH wnl.  Past Medical History  Diagnosis Date  . Malignant neoplasm of intestinal tract, part unspecified 09/17/2006    left mandibular sarcomatoid cancer treated in [place] in 2009 with radiation through 2010; last seen 06/2010 with no signs of deformity or progression  . 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 Allergic rhinitis 09/16/2008  . Hyperlipidemia 09/01/2008  . Urinary frequency 08/30/2008  . H/O: iron deficiency anemia 08/30/2008  . Malignant neoplasm of mandible 07/07/2008    left  . CVA (cerebral infarction) 06/30/2007  . Parkinson disease 06/30/2007  . Smoker 06/24/2006  . Seizure disorder 06/24/2006  . Peripheral vascular disease 06/24/2006  . Stroke   . Hx of radiation therapy 04/12/09 -05/25/09    left infratemporal fossa   Current Outpatient Prescriptions  Medication Sig Dispense Refill  . cetirizine (ZYRTEC) 10 MG tablet Take 10 mg by mouth as needed for allergies.      Marland Kitchen dipyridamole-aspirin (AGGRENOX) 200-25 MG per 12 hr capsule Take 1 capsule by mouth 2 (two) times daily.  60 capsule  6  . docusate sodium (COLACE) 100 MG capsule Take 100 mg by mouth daily.        Marland Kitchen erythromycin ophthalmic ointment every 6 (six)  hours. Apply to left eyelids twice a day for one week.      Marland Kitchen FLUZONE HIGH-DOSE SUSP Inject 1 application into the muscle once.      . Influenza Vac Split High-Dose (FLUZONE HIGH-DOSE) SUSP       . levETIRAcetam (KEPPRA) 500 MG tablet One tablet po qam and two tablets po qhs- DAW-1  90 tablet  6  . loratadine (CLARITIN) 10 MG tablet Take 10 mg by mouth daily as needed for allergies.      . magnesium hydroxide (MILK OF MAGNESIA) 400 MG/5ML suspension Take by mouth daily as needed. Capful as needed constipation      . ofloxacin (OCUFLOX) 0.3 % ophthalmic solution       . Polyethyl Glycol-Propyl Glycol (SYSTANE ULTRA) 0.4-0.3 % SOLN Apply to eye 3 times daily.      . pravastatin (PRAVACHOL) 20 MG tablet Take 1 tablet (20 mg total) by mouth daily.  30 tablet  11  . prednisoLONE acetate (PRED FORTE) 1 % ophthalmic suspension       . rOPINIRole (REQUIP) 1 MG tablet Take 1 tablet (1 mg total) by mouth 3 (three) times daily.  90 tablet  6   No current facility-administered medications for this visit.   Family History  Problem Relation Age of Onset  . Heart disease Mother   . Heart attack Mother   . Heart disease Father   . Heart attack Father   . Colon cancer Neg Hx  History   Social History  . Marital Status: Married    Spouse Name: [name]    Number of Children: 6  . Years of Education: HS   Occupational History  . Retired    Social History Main Topics  . Smoking status: Former Smoker -- 50 years    Types: Cigarettes    Quit date: 11/12/2007  . Smokeless tobacco: Former Systems developer    Quit date: 10/01/1999  . Alcohol Use: No  . Drug Use: No  . Sexual Activity: None   Other Topics Concern  . None   Social History Narrative   Patient lives at home with spouse.   Caffeine Use: 2 cups of coffee; 2-3 sodas daily   Review of Systems: General: Denies fever, chills, diaphoresis, appetite change and fatigue.  HEENT: Denies change in vision, eye pain, congestion, sore throat, sneezing,  mouth sores, trouble swallowing, neck pain, neck stiffness and tinnitus.   Respiratory: Denies cough, SOB, DOE, chest tightness, and wheezing.   Cardiovascular: Denies chest pain, palpitations and leg swelling.  Gastrointestinal: Denies nausea, vomiting, abdominal pain, diarrhea, blood in stool and abdominal distention.  Genitourinary: Denies dysuria, urgency, frequency, hematuria, flank pain and difficulty urinating.  Endocrine: Denies hot or cold intolerance, sweats, polyuria, polydipsia. Musculoskeletal: History of Parkinson's, w/ tremor and minor gait abnormality. Denies myalgias, back pain, joint swelling, arthralgias. Skin: Denies pallor, rash and wounds.  Neurological: History of seizure disorder, last seizure 3 months ago. Denies dizziness, syncope, weakness, lightheadedness, numbness and headaches.  Hematological: Denies adenopathy,easy bruising, personal or family bleeding history.  Psychiatric/Behavioral: Denies mood changes, confusion, nervousness, sleep disturbance and agitation.  Objective:  Physical Exam: Filed Vitals:   11/23/13 1549  BP: 121/70  Pulse: 52  Temp: 96.9 F (36.1 C)  TempSrc: Oral  Height: 6' (1.829 m)  Weight: 184 lb 11.2 oz (83.779 kg)  SpO2: 99%  General: Vital signs reviewed.  Patient is a well-developed and well-nourished, in no acute distress and cooperative with exam. Alert and oriented x3.  Head: Normocephalic and atraumatic. Nose: No erythema or drainage noted.  Turbinates normal. Mouth: No erythema, exudates, sores, or ulcerations. Moist mucus membranes. Facial asymmetry s/p L. madibular resection in '09. Eyes: PERRL, EOMI, conjunctivae normal, No scleral icterus.  Neck: Supple, trachea midline, normal ROM, No JVD, masses, thyromegaly, or carotid bruit present.  Cardiovascular: RRR, S1 normal, S2 normal, no murmurs, gallops, or rubs. Pulmonary/Chest: normal respiratory effort, CTAB, no wheezes, rales, or rhonchi. Abdominal: Soft. Non-tender,  non-distended, bowel sounds are normal, no masses, organomegaly, or guarding present.  GU: No CVA tenderness. Musculoskeletal: No joint deformities, erythema, or stiffness, ROM full and nontender. Extremities: Trace edema in LE's.  pulses symmetric and intact bilaterally. No cyanosis or clubbing. Neurological: A&O x3. Resting tremor, more prominent on right side. Strength is normal and symmetric bilaterally, cranial nerve II-XII are grossly intact, no focal motor deficit, sensory intact to light touch bilaterally.  Skin: Warm, dry and intact. No rashes or erythema. Psychiatric: Normal mood and affect. speech and behavior is normal. Cognition and memory are normal.   Assessment & Plan:   Please see problem based assessment and plan.

## 2013-11-23 NOTE — Patient Instructions (Signed)
1. Please schedule a follow up appointment for ~ 1 year.   2. Please take all medications as prescribed.   3. If you have worsening of your symptoms or new symptoms arise, please call the clinic ([phone]), or go to the ER immediately if symptoms are severe.  You have done a great job in taking all your medications. I appreciate it very much. Please continue doing that.

## 2013-11-24 ENCOUNTER — Encounter: Payer: Self-pay | Admitting: Internal Medicine

## 2013-11-24 NOTE — Assessment & Plan Note (Signed)
Patient complains of mild allergies, however, he claims they are well controlled at this time.

## 2013-11-24 NOTE — Assessment & Plan Note (Signed)
Repeat lipid panel shows all values wnl.  -Continue statin given h/o CVA.

## 2013-11-24 NOTE — Assessment & Plan Note (Signed)
Patient reports no seizures within the last year. -Continue Keppra.

## 2013-11-25 NOTE — Progress Notes (Signed)
Case discussed with Dr. Jones at time of visit.  We reviewed the resident's history and exam and pertinent patient test results.  I agree with the assessment, diagnosis, and plan of care documented in the resident's note. 

## 2013-11-26 ENCOUNTER — Telehealth: Payer: Self-pay | Admitting: *Deleted

## 2013-11-26 NOTE — Telephone Encounter (Signed)
Pt's wife calls and states someone called their home about lunch time and said they were from internal medicine, could not find record of anyone calling. She would like for dr to call them about lab results

## 2013-12-21 ENCOUNTER — Ambulatory Visit: Payer: Medicare Other | Admitting: Radiation Oncology

## 2013-12-23 ENCOUNTER — Encounter: Payer: Self-pay | Admitting: Radiation Oncology

## 2013-12-23 ENCOUNTER — Other Ambulatory Visit: Payer: Self-pay

## 2013-12-23 ENCOUNTER — Ambulatory Visit
Admission: RE | Admit: 2013-12-23 | Discharge: 2013-12-23 | Disposition: A | Discharge status: Home / Self Care | Payer: Medicare Other | Source: Ambulatory Visit | Attending: Radiation Oncology | Admitting: Radiation Oncology

## 2013-12-23 VITALS — BP 132/68 | HR 48 | Resp 16 | Wt 185.3 lb

## 2013-12-23 DIAGNOSIS — R569 Unspecified convulsions: Secondary | ICD-10-CM

## 2013-12-23 DIAGNOSIS — C411 Malignant neoplasm of mandible: Secondary | ICD-10-CM

## 2013-12-23 MED ORDER — LEVETIRACETAM 500 MG PO TABS: ORAL_TABLET | ORAL | Status: DC

## 2013-12-23 NOTE — Progress Notes (Signed)
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  Patient presents today for six month follow up accompanied by his wife and daughter. Heart rate 48. Patient denies feeling lightheaded or weak. All other vitals WDL. Weight stable. Daughter reports her father had left eye cataract removed in May. No sores or ulcerations of the mouth noted. No lymphedema of the neck noted. Reports thick rope life saliva is no better or worse. Denies difficulty eating or swallowing foods. TSH last drawn six months ago. Patient has already seen PCP this year but, doesn't have an appointment scheduled for next year. Denies ringing in the ears. Denies pain. Flat seated beautifully                             ALLERGIES:  has No Known Allergies.  Meds: Current Outpatient Prescriptions  Medication Sig Dispense Refill  . cetirizine (ZYRTEC) 10 MG tablet Take 10 mg by mouth as needed for allergies.      Marland Kitchen dipyridamole-aspirin (AGGRENOX) 200-25 MG per 12 hr capsule Take 1 capsule by mouth 2 (two) times daily.  60 capsule  6  . levETIRAcetam (KEPPRA) 500 MG tablet One tablet po qam and two tablets po qhs  90 tablet  0  . loratadine (CLARITIN) 10 MG tablet Take 10 mg by mouth daily as needed for allergies.      . magnesium hydroxide (MILK OF MAGNESIA) 400 MG/5ML suspension Take by mouth daily as needed. Capful as needed constipation      . Polyethyl Glycol-Propyl Glycol (SYSTANE ULTRA) 0.4-0.3 % SOLN  Apply to eye 3 times daily.      . pravastatin (PRAVACHOL) 20 MG tablet Take 1 tablet (20 mg total) by mouth daily.  30 tablet  11  . rOPINIRole (REQUIP) 1 MG tablet Take 1 tablet (1 mg total) by mouth 3 (three) times daily.  90 tablet  6  . erythromycin ophthalmic ointment every 6 (six) hours. Apply to left eyelids twice a day for one week.      Marland Kitchen FLUZONE HIGH-DOSE SUSP Inject 1 application into the muscle once.      . Influenza Vac Split High-Dose (FLUZONE HIGH-DOSE) 0.5 ML SUSY       . Influenza Vac Split High-Dose (FLUZONE HIGH-DOSE) SUSP       . ofloxacin (OCUFLOX) 0.3 % ophthalmic solution       . prednisoLONE acetate (PRED FORTE) 1 % ophthalmic suspension       . White Petrolatum-Mineral Oil (PURALUBE) 85-15 % OINT Apply to eye.       No current facility-administered medications for this encounter.    Physical Findings: The patient is in no acute distress. Patient is alert and oriented.  weight is 185 lb 4.8 oz (84.052 kg). His blood pressure is 132/68 and his pulse is 48. His respiration is 16 and oxygen saturation is 100%. .He has no  trismus. Within the oral cavity, there are no worrisome masses, mucosal lesions, or ulcerations. The flap appears to be well-seated. Neck exam is benign without any lymphadenopathy. No significant changes.  No significant changes.  Lab Findings: Lab Results  Component Value Date   WBC 5.1 11/23/2013   HGB 13.8 11/23/2013   HCT 41.2 11/23/2013   MCV 94.5 11/23/2013   PLT 256 11/23/2013    Impression:  The patient has no evidence of recurrence.  Plan:  Follow-up in one year.  _____________________________________  Sheral Apley. Tammi Klippel, M.D.

## 2013-12-23 NOTE — Progress Notes (Signed)
Patient presents today for six month follow up accompanied by his wife and daughter. Heart rate 48. Patient denies feeling lightheaded or weak. All other vitals WDL. Weight stable. Daughter reports her father had left eye cataract removed in May. No sores or ulcerations of the mouth noted. No lymphedema of the neck noted. Reports thick rope life saliva is no better or worse. Denies difficulty eating or swallowing foods. TSH last drawn six months ago. Patient has already seen PCP this year but, doesn't have an appointment scheduled for next year. Denies ringing in the ears. Denies pain. Flat seated beautifully.

## 2013-12-29 ENCOUNTER — Ambulatory Visit (INDEPENDENT_AMBULATORY_CARE_PROVIDER_SITE_OTHER): Payer: Medicare Other | Admitting: Nurse Practitioner

## 2013-12-29 ENCOUNTER — Encounter: Payer: Self-pay | Admitting: Nurse Practitioner

## 2013-12-29 VITALS — BP 113/70 | HR 64 | Ht 71.0 in | Wt 185.0 lb

## 2013-12-29 DIAGNOSIS — I6350 Cerebral infarction due to unspecified occlusion or stenosis of unspecified cerebral artery: Secondary | ICD-10-CM

## 2013-12-29 DIAGNOSIS — R569 Unspecified convulsions: Secondary | ICD-10-CM

## 2013-12-29 DIAGNOSIS — G20A1 Parkinson's disease without dyskinesia, without mention of fluctuations: Secondary | ICD-10-CM

## 2013-12-29 DIAGNOSIS — G20 Parkinson's disease: Secondary | ICD-10-CM

## 2013-12-29 MED ORDER — LEVETIRACETAM 500 MG PO TABS: ORAL_TABLET | ORAL | Status: DC

## 2013-12-29 NOTE — Progress Notes (Signed)
[facility]  PATIENT: Chase Berger DOB: 07/20/1933   REASON FOR VISIT: Followup for stroke, Parkinson's, and seizure disorder    HISTORY OF PRESENT ILLNESS: [id number], 80 year old male returns for followup. He has a history of remote stroke, Parkinson's disease, and seizure disorder.He is currently on Keppra Brand. He has had break through seizures on generic in the past.  He is on Aggrenox twice a day for stroke prevention, and Requip for his minor Parkinson's symptoms. No seizure activity since February 2010. 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  He is doing well, independent in daily living. Remains on soft foods and weight is stable. Some difficulty with swallowing at times. No new neurological complaints.   REVIEW OF SYSTEMS: Full 14 system review of systems performed and notable only for those listed, all others are neg:  Constitutional: N/A  Cardiovascular: N/A  Ear/Nose/Throat: N/A  Skin: N/A  Eyes: N/A  Respiratory: N/A  Gastroitestinal: N/A  Hematology/Lymphatic: N/A  Endocrine: N/A Musculoskeletal:N/A  Allergy/Immunology: N/A  Neurological: N/A Psychiatric: N/A Sleep : NA   ALLERGIES: No Known Allergies  HOME MEDICATIONS: Outpatient Prescriptions Prior to Visit  Medication Sig Dispense Refill  . cetirizine (ZYRTEC) 10 MG tablet Take 10 mg by mouth as needed for allergies.      Marland Kitchen dipyridamole-aspirin (AGGRENOX) 200-25 MG per 12 hr capsule Take 1 capsule by mouth 2 (two) times daily.  60 capsule  6  . erythromycin ophthalmic ointment every 6 (six) hours. Apply to left eyelids twice a day for one week.      Marland Kitchen FLUZONE HIGH-DOSE SUSP Inject 1 application into the muscle once.      . Influenza Vac Split High-Dose (FLUZONE HIGH-DOSE) 0.5 ML SUSY       . Influenza Vac Split High-Dose (FLUZONE HIGH-DOSE) SUSP       . levETIRAcetam (KEPPRA) 500 MG tablet One tablet po qam and  two tablets po qhs  90 tablet  0  . loratadine (CLARITIN) 10 MG tablet Take 10 mg by mouth daily as needed for allergies.      . magnesium hydroxide (MILK OF MAGNESIA) 400 MG/5ML suspension Take by mouth daily as needed. Capful as needed constipation      . ofloxacin (OCUFLOX) 0.3 % ophthalmic solution       . Polyethyl Glycol-Propyl Glycol (SYSTANE ULTRA) 0.4-0.3 % SOLN Apply to eye 3 times daily.      . pravastatin (PRAVACHOL) 20 MG tablet Take 1 tablet (20 mg total) by mouth daily.  30 tablet  11  . prednisoLONE acetate (PRED FORTE) 1 % ophthalmic suspension       . rOPINIRole (REQUIP) 1 MG tablet Take 1 tablet (1 mg total) by mouth 3 (three) times daily.  90 tablet  6  . White Petrolatum-Mineral Oil (PURALUBE) 85-15 % OINT Apply to eye.       No facility-administered medications prior to visit.    PAST MEDICAL HISTORY: Past Medical History  Diagnosis Date  . Malignant neoplasm of intestinal tract, part unspecified 09/17/2006    left mandibular sarcomatoid cancer treated in [place] in 2009 with radiation through 2010; last seen 06/2010 with no signs of deformity or progression  . 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 Allergic rhinitis 09/16/2008  . Hyperlipidemia 09/01/2008  . Urinary frequency 08/30/2008  . H/O: iron deficiency anemia 08/30/2008  . Malignant neoplasm of mandible 07/07/2008    left  . CVA (cerebral  infarction) 06/30/2007  . Parkinson disease 06/30/2007  . Smoker 06/24/2006  . Seizure disorder 06/24/2006  . Peripheral vascular disease 06/24/2006  . Stroke   . Hx of radiation therapy 04/12/09 -05/25/09    left infratemporal fossa    PAST SURGICAL HISTORY: Past Surgical History  Procedure Laterality Date  . Cancer removed from jaw      FAMILY HISTORY: Family History  Problem Relation Age of Onset  . Heart disease Mother   . Heart attack Mother   . Heart disease Father   . Heart attack Father   . Colon cancer Neg Hx     SOCIAL HISTORY: History    Social History  . Marital Status: Married    Spouse Name: [name]    Number of Children: 6  . Years of Education: HS   Occupational History  . Retired    Social History Main Topics  . Smoking status: Former Smoker -- 50 years    Types: Cigarettes    Quit date: 11/12/2007  . Smokeless tobacco: Former Systems developer    Quit date: 10/01/1999  . Alcohol Use: No  . Drug Use: No  . Sexual Activity: Not on file   Other Topics Concern  . Not on file   Social History Narrative   Patient lives at home with spouse.   Caffeine Use: 2 cups of coffee; 2-3 sodas daily     PHYSICAL EXAM  Filed Vitals:   12/29/13 1403  BP: 113/70  Pulse: 64  Height: _0  (1.803 m)  Weight: 185 lb (83.915 kg)   Body mass index is 25.81 kg/(m^2).  Generalized: Well developed, in no acute distress  Head: Distorted left mandibular and cheek region due to surgical resection, oropharynx benign Neck: Supple, no carotid bruits  Cardiac: Regular rate rhythm, no murmur  Musculoskeletal: No deformity   Neurological examination   Mentation: Alert oriented to time, place, history taking. Follows all commands speech and language fluent  Cranial nerve II-XII: Pupils were equal round reactive to light extraocular movements were full, visual field were full on confrontational test. 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 normal. hearing was intact to finger rubbing bilaterally. Uvula tongue midline. head turning and shoulder shrug were normal and symmetric.Tongue protrusion into cheek strength was normal. Motor: normal bulk and tone, full strength in the BUE, BLE, fine finger movements normal, no pronator drift. No focal weakness, no cogwheeling, no resting tremor. Sensory: normal and symmetric to light touch, pinprick, and  vibration  Coordination: finger-nose-finger, heel-to-shin bilaterally, no dysmetria Reflexes:  Brachioradialis 2/2, biceps 2/2, triceps 2/2, patellar 2/2, Achilles 2/2, plantar responses were flexor bilaterally. Gait and Station: Rising up from seated position without assistance, normal stance,  moderate stride, good arm swing, smooth turning, able to perform tiptoe, and heel walking without difficulty. No assistive device  DIAGNOSTIC DATA (LABS, IMAGING, TESTING) - I reviewed patient records, labs, notes, testing and imaging myself where available.  Lab Results  Component Value Date   WBC 5.1 11/23/2013   HGB 13.8 11/23/2013   HCT 41.2 11/23/2013   MCV 94.5 11/23/2013   PLT 256 11/23/2013      Component Value Date/Time   NA 141 11/23/2013 1623   K 4.5 11/23/2013 1623   CL 102 11/23/2013 1623   CO2 30 11/23/2013 1623   GLUCOSE 75 11/23/2013 1623   BUN 8 11/23/2013 1623   CREATININE 0.91 11/23/2013 1623  CREATININE 0.96 11/15/2009 2046   CALCIUM 9.9 11/23/2013 1623   PROT 6.8 06/17/2012 1152   ALBUMIN 4.3 06/17/2012 1152   AST 16 06/17/2012 1152   ALT 14 06/17/2012 1152   ALKPHOS 42 06/17/2012 1152   BILITOT 0.5 06/17/2012 1152   GFRNONAA 80 11/23/2013 1623   GFRNONAA 39* 10/28/2008 0905   GFRAA >89 11/23/2013 1623   GFRAA  Value: 47        The eGFR has been calculated using the MDRD equation. This calculation has not been validated in all clinical situations. eGFR's persistently <60 mL/min signify possible Chronic Kidney Disease.* 10/28/2008 0905   Lab Results  Component Value Date   CHOL 133 11/23/2013   HDL 42 11/23/2013   LDLCALC 78 11/23/2013   TRIG 67 11/23/2013   CHOLHDL 3.2 11/23/2013    Lab Results  Component Value Date   TSH 1.208 06/17/2013      ASSESSMENT AND PLAN  80 y.o. year old male  has a past medical history of Malignant neoplasm of intestinal tract, part unspecified (09/17/2006); Tubulovillous adenoma of colon (09/17/2006); Allergic rhinitis (09/16/2008); Hyperlipidemia (09/01/2008); Urinary frequency (08/30/2008); H/O: iron deficiency anemia (08/30/2008); Malignant  neoplasm of mandible (07/07/2008); CVA (cerebral infarction) (06/30/2007); Parkinson disease (06/30/2007); Smoker (06/24/2006); Seizure disorder (06/24/2006); Peripheral vascular disease (06/24/2006); Stroke; and radiation therapy (04/12/09 -05/25/09). here to followup. No further stroke or TIA symptoms. Parkinson symptoms are stable. No seizures in several years  Continue Aggrenox at current dose Continue Requip at current dose Continue Keppra at current dose will refill, Rx to wife Followup in 6-8 months Dennie Bible, [facility], [facility], [facility] [address], [place], [place] [address] [phone]

## 2013-12-29 NOTE — Patient Instructions (Signed)
Continue Aggrenox at current dose Continue Requip at current dose Continue Keppra at current dose will refill, Rx to wife Followup in 6-8 months

## 2014-04-21 ENCOUNTER — Other Ambulatory Visit: Payer: Self-pay

## 2014-04-21 MED ORDER — ASPIRIN-DIPYRIDAMOLE ER 25-200 MG PO CP12: 1.0000 | ORAL_CAPSULE | Freq: Two times a day (BID) | ORAL | Status: DC

## 2014-06-20 ENCOUNTER — Other Ambulatory Visit: Payer: Self-pay | Admitting: Neurology

## 2014-07-06 ENCOUNTER — Ambulatory Visit: Payer: Medicare Other | Admitting: Nurse Practitioner

## 2014-07-19 ENCOUNTER — Other Ambulatory Visit: Payer: Self-pay | Admitting: Neurology

## 2014-08-23 DIAGNOSIS — H25011 Cortical age-related cataract, right eye: Secondary | ICD-10-CM | POA: Diagnosis not present

## 2014-08-23 DIAGNOSIS — H04222 Epiphora due to insufficient drainage, left lacrimal gland: Secondary | ICD-10-CM | POA: Diagnosis not present

## 2014-08-23 DIAGNOSIS — H2511 Age-related nuclear cataract, right eye: Secondary | ICD-10-CM | POA: Diagnosis not present

## 2014-08-23 DIAGNOSIS — H35033 Hypertensive retinopathy, bilateral: Secondary | ICD-10-CM | POA: Diagnosis not present

## 2014-08-31 ENCOUNTER — Telehealth: Payer: Self-pay | Admitting: Nurse Practitioner

## 2014-08-31 NOTE — Telephone Encounter (Signed)
Patient's daughter is calling because the [facility] will not pay for Demetra Shiner unless the doctor states that this drug is preferred for his treatment.  Please call.

## 2014-08-31 NOTE — Telephone Encounter (Signed)
All requested info has been forward to ins.  Request is under review.  I called back.  Phone rang several times with no answer, no option to leave message.

## 2014-09-05 ENCOUNTER — Telehealth: Payer: Self-pay

## 2014-09-05 NOTE — Telephone Encounter (Signed)
[facility] notified us they have approved the request for coverage on Keppra effective until 08/13/2015 Ref # [id number]

## 2014-09-14 DIAGNOSIS — C411 Malignant neoplasm of mandible: Secondary | ICD-10-CM | POA: Diagnosis not present

## 2014-09-20 ENCOUNTER — Encounter: Payer: Self-pay | Admitting: Nurse Practitioner

## 2014-09-20 ENCOUNTER — Ambulatory Visit (INDEPENDENT_AMBULATORY_CARE_PROVIDER_SITE_OTHER): Payer: 59 | Admitting: Nurse Practitioner

## 2014-09-20 VITALS — BP 133/72 | HR 65 | Ht 73.0 in | Wt 188.0 lb

## 2014-09-20 DIAGNOSIS — G20 Parkinson's disease: Secondary | ICD-10-CM

## 2014-09-20 DIAGNOSIS — R569 Unspecified convulsions: Secondary | ICD-10-CM | POA: Diagnosis not present

## 2014-09-20 DIAGNOSIS — I6350 Cerebral infarction due to unspecified occlusion or stenosis of unspecified cerebral artery: Secondary | ICD-10-CM

## 2014-09-20 DIAGNOSIS — I639 Cerebral infarction, unspecified: Secondary | ICD-10-CM

## 2014-09-20 DIAGNOSIS — R5601 Complex febrile convulsions: Secondary | ICD-10-CM | POA: Diagnosis not present

## 2014-09-20 MED ORDER — ASPIRIN-DIPYRIDAMOLE ER 25-200 MG PO CP12: 1.0000 | ORAL_CAPSULE | Freq: Two times a day (BID) | ORAL | Status: DC

## 2014-09-20 MED ORDER — LEVETIRACETAM 500 MG PO TABS: ORAL_TABLET | ORAL | Status: DC

## 2014-09-20 MED ORDER — ROPINIROLE HCL 1 MG PO TABS: 1.0000 mg | ORAL_TABLET | Freq: Three times a day (TID) | ORAL | Status: DC

## 2014-09-20 NOTE — Patient Instructions (Signed)
Continue Aggrenox for secondary stroke prevention, will refill Continue Keppra for seizure prevention, will refill Call for any seizure activity Continue Requip for parkinsons disease Be careful with ambulation due to risk for falls F/U 6 to 8 months

## 2014-09-20 NOTE — Progress Notes (Signed)
[facility]  PATIENT: Chase Berger DOB: 09-11-1932   REASON FOR VISIT: Follow-up for history of stroke, Parkinson's disease, and seizure disorder HISTORY FROM:patient and wife  HISTORY OF PRESENT ILLNESS: [id number], 81 year old male returns for followup. He has a history of remote stroke, Parkinson's disease, and seizure disorder.He is currently on Keppra Brand. He has had break through seizures on generic in the past. He is on Aggrenox twice a day for stroke prevention, and Requip for his minor Parkinson's symptoms. No seizure activity since February 2010. 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  He is doing well, independent in daily living. Remains on soft foods and weight is stable. Some difficulty with swallowing at times. No falls . He has not had seizure activity or symptoms of stroke or TIA since last seen . His Parkinson's symptoms are stable No new neurological complaints.    REVIEW OF SYSTEMS: Full 14 system review of systems performed and notable only for those listed, all others are neg:  Constitutional: neg  Cardiovascular: neg Ear/Nose/Throat: drooling Skin: neg Eyes:nreddness Respiratory: neg Gastroitestinal: neg  Hematology/Lymphatic: neg  Endocrine: neg Musculoskeletal:neg Allergy/Immunology: neg Neurological:history of stroke, seizure and parkinson's disease Psychiatric: neg Sleep : neg   ALLERGIES: No Known Allergies  HOME MEDICATIONS: Outpatient Prescriptions Prior to Visit  Medication Sig Dispense Refill  . cetirizine (ZYRTEC) 10 MG tablet Take 10 mg by mouth as needed for allergies.    Marland Kitchen dipyridamole-aspirin (AGGRENOX) 200-25 MG per 12 hr capsule Take 1 capsule by mouth 2 (two) times daily. 60 capsule 6  . erythromycin ophthalmic ointment every 6 (six) hours. Apply to left eyelids twice a day for one week.    Marland Kitchen FLUZONE HIGH-DOSE SUSP Inject 1 application into  the muscle once.    . Influenza Vac Split High-Dose (FLUZONE HIGH-DOSE) 0.5 ML SUSY     . Influenza Vac Split High-Dose (FLUZONE HIGH-DOSE) SUSP     . levETIRAcetam (KEPPRA) 500 MG tablet One tablet po qam and two tablets po qhs 90 tablet 8  . loratadine (CLARITIN) 10 MG tablet Take 10 mg by mouth daily as needed for allergies.    . magnesium hydroxide (MILK OF MAGNESIA) 400 MG/5ML suspension Take by mouth daily as needed. Capful as needed constipation    . ofloxacin (OCUFLOX) 0.3 % ophthalmic solution     . Polyethyl Glycol-Propyl Glycol (SYSTANE ULTRA) 0.4-0.3 % SOLN Apply to eye 3 times daily.    . pravastatin (PRAVACHOL) 20 MG tablet Take 1 tablet (20 mg total) by mouth daily. 30 tablet 11  . prednisoLONE acetate (PRED FORTE) 1 % ophthalmic suspension     . rOPINIRole (REQUIP) 1 MG tablet take 1 tablet by mouth three times a day 90 tablet 2  . White Petrolatum-Mineral Oil (PURALUBE) 85-15 % OINT Apply to eye.     No facility-administered medications prior to visit.    PAST MEDICAL HISTORY: Past Medical History  Diagnosis Date  . Malignant neoplasm of intestinal tract, part unspecified 09/17/2006    left mandibular sarcomatoid cancer treated in [place] in 2009 with radiation through 2010; last seen 06/2010 with no signs of deformity or progression  . 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 Allergic rhinitis 09/16/2008  . Hyperlipidemia 09/01/2008  . Urinary frequency 08/30/2008  . H/O: iron deficiency anemia 08/30/2008  . Malignant neoplasm of mandible 07/07/2008    left  . CVA (cerebral infarction) 06/30/2007  . Parkinson disease 06/30/2007  .  Smoker 06/24/2006  . Seizure disorder 06/24/2006  . Peripheral vascular disease 06/24/2006  . Stroke   . Hx of radiation therapy 04/12/09 -05/25/09    left infratemporal fossa    PAST SURGICAL HISTORY: Past Surgical History  Procedure Laterality Date  . Cancer removed from jaw      FAMILY HISTORY: Family History  Problem  Relation Age of Onset  . Heart disease Mother   . Heart attack Mother   . Heart disease Father   . Heart attack Father   . Colon cancer Neg Hx     SOCIAL HISTORY: History   Social History  . Marital Status: Married    Spouse Name: [name]    Number of Children: 6  . Years of Education: HS   Occupational History  . Retired    Social History Main Topics  . Smoking status: Former Smoker -- 50 years    Types: Cigarettes    Quit date: 11/12/2007  . Smokeless tobacco: Former Systems developer    Quit date: 10/01/1999  . Alcohol Use: No  . Drug Use: No  . Sexual Activity: Not on file   Other Topics Concern  . Not on file   Social History Narrative   Patient lives at home with spouse. ( [name] ).   Retired.   Education high school.   Right handed.   Caffeine two cups daily of coffee.     PHYSICAL EXAM  Filed Vitals:   09/20/14 1531  BP: 133/72  Pulse: 65  Height: 6' 1"  (1.854 m)  Weight: 188 lb (85.276 kg)   Body mass index is 24.81 kg/(m^2). Generalized: Well developed, in no acute distress  Head: Distorted left mandibular and cheek region due to surgical resection, oropharynx benign Neck: Supple, no carotid bruits  Cardiac: Regular rate rhythm, no murmur  Musculoskeletal: No deformity   Neurological examination   Mentation: Alert oriented to time, place, history taking. Follows all commands speech and language fluent  Cranial nerve II-XII: Pupils were equal round reactive to light extraocular movements were full, visual field were full on confrontational test. 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 normal. hearing was intact to finger rubbing bilaterally. Uvula tongue midline. head turning and shoulder shrug were normal and symmetric.Tongue protrusion into cheek strength was normal. Motor: normal bulk and tone, full strength in the BUE, BLE, fine finger movements normal, no pronator  drift. No focal weakness, no cogwheeling, no resting tremor. Sensory: normal and symmetric to light touch, pinprick, and vibration  Coordination: finger-nose-finger, heel-to-shin bilaterally, no dysmetria Reflexes: Brachioradialis 2/2, biceps 2/2, triceps 2/2, patellar 2/2, Achilles 2/2, plantar responses were flexor bilaterally. Gait and Station: Rising up from seated position without assistance, normal stance, moderate stride, good arm swing, smooth turning, able to perform tiptoe, and heel walking without difficulty. No assistive device    DIAGNOSTIC DATA (LABS, IMAGING, TESTING) - I reviewed patient records, labs, notes, testing and imaging myself where available.  Lab Results  Component Value Date   WBC 5.1 11/23/2013   HGB 13.8 11/23/2013   HCT 41.2 11/23/2013   MCV 94.5 11/23/2013   PLT 256 11/23/2013      Component Value Date/Time   NA 141 11/23/2013 1623   K 4.5 11/23/2013 1623   CL 102 11/23/2013 1623   CO2 30 11/23/2013 1623   GLUCOSE 75 11/23/2013 1623   BUN 8 11/23/2013 1623   CREATININE 0.91 11/23/2013  1623   CREATININE 0.96 11/15/2009 2046   CALCIUM 9.9 11/23/2013 1623   PROT 6.8 06/17/2012 1152   ALBUMIN 4.3 06/17/2012 1152   AST 16 06/17/2012 1152   ALT 14 06/17/2012 1152   ALKPHOS 42 06/17/2012 1152   BILITOT 0.5 06/17/2012 1152   GFRNONAA 80 11/23/2013 1623   GFRNONAA 39* 10/28/2008 0905   GFRAA >89 11/23/2013 1623   GFRAA * 10/28/2008 0905    47        The eGFR has been calculated using the MDRD equation. This calculation has not been validated in all clinical situations. eGFR's persistently <60 mL/min signify possible Chronic Kidney Disease.   Lab Results  Component Value Date   CHOL 133 11/23/2013   HDL 42 11/23/2013   LDLCALC 78 11/23/2013   TRIG 67 11/23/2013   CHOLHDL 3.2 11/23/2013       ASSESSMENT AND PLAN  81 y.o. year old male  has a past medical history of Malignant neoplasm of intestinal tract, part unspecified  (09/17/2006); Tubulovillous adenoma of colon (09/17/2006); Allergic rhinitis (09/16/2008); Hyperlipidemia (09/01/2008); Urinary frequency (08/30/2008); H/O: iron deficiency anemia (08/30/2008); Malignant neoplasm of mandible (07/07/2008); CVA (cerebral infarction) (06/30/2007); Parkinson disease (06/30/2007);  Seizure disorder (06/24/2006); Peripheral vascular disease (06/24/2006); Stroke; and radiation therapy (04/12/09 -05/25/09). here to follow up. The patient is a current patient of Dr. Krista Blue  who is out of the office today . This note is sent to the work in doctor.     Continue Aggrenox for secondary stroke prevention, will refill Continue Keppra for seizure prevention, will refill Call for any seizure activity Continue Requip for parkinsons disease Be careful with ambulation due to risk for falls F/U 6 to 8 months Dennie Bible, [facility], [facility], APRN  [facility] [address] [place], [place] [address] [phone]

## 2014-09-22 NOTE — Progress Notes (Signed)
I reviewed above note and agree with the assessment and plan.  Rosalin Hawking, MD PhD Stroke Neurology 09/22/2014 10:33 AM

## 2014-09-22 NOTE — Progress Notes (Signed)
I have reviewed and agreed above plan. 

## 2014-10-05 DIAGNOSIS — H2511 Age-related nuclear cataract, right eye: Secondary | ICD-10-CM | POA: Diagnosis not present

## 2014-10-18 ENCOUNTER — Other Ambulatory Visit: Payer: Self-pay | Admitting: *Deleted

## 2014-10-20 MED ORDER — PRAVASTATIN SODIUM 20 MG PO TABS: 20.0000 mg | ORAL_TABLET | Freq: Every day | ORAL | Status: DC

## 2014-12-15 ENCOUNTER — Encounter: Payer: Self-pay | Admitting: Gastroenterology

## 2014-12-16 ENCOUNTER — Ambulatory Visit
Admission: RE | Admit: 2014-12-16 | Discharge: 2014-12-16 | Disposition: A | Discharge status: Home / Self Care | Payer: Medicare Other | Source: Ambulatory Visit | Attending: Radiation Oncology | Admitting: Radiation Oncology

## 2014-12-16 ENCOUNTER — Telehealth: Payer: Self-pay | Admitting: Radiation Oncology

## 2014-12-16 ENCOUNTER — Encounter: Payer: Self-pay | Admitting: Radiation Oncology

## 2014-12-16 VITALS — BP 138/65 | HR 54 | Resp 16 | Wt 196.0 lb

## 2014-12-16 DIAGNOSIS — C411 Malignant neoplasm of mandible: Secondary | ICD-10-CM

## 2014-12-16 DIAGNOSIS — Z923 Personal history of irradiation: Secondary | ICD-10-CM

## 2014-12-16 LAB — TSH CHCC: TSH: 0.776 m[IU]/L (ref 0.320–4.118)

## 2014-12-16 NOTE — Telephone Encounter (Signed)
-----   Message from Tyler Pita, MD sent at 12/16/2014 12:44 PM EDT ----- Please call patient with normal result.  Thanks. MM

## 2014-12-16 NOTE — Progress Notes (Signed)
Quick Note:  Please call patient with normal result.  Thanks. MM ______ 

## 2014-12-16 NOTE — Progress Notes (Signed)
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            Here for one year follow up with Dr. Tammi Klippel. TSH hasn't been drawn since last year. Weight and vitals stable. Denies pain or fatigue. Denies dry mouth. Reports thick rope like saliva is less. Denies pain associated with or difficulty swallowing. Denies ulcerations of sore of the oral mucosa. Denies ringing in the ears. Steady gait noted. Patient is HOH. Pleasant affect noted [facility] in [place] with no evidence of recurrence. No otoalgia.                Meds: Current Outpatient Prescriptions  Medication Sig Dispense Refill  . cetirizine (ZYRTEC) 10 MG tablet Take 10 mg by mouth as needed for allergies.    Marland Kitchen dipyridamole-aspirin (AGGRENOX) 200-25 MG per 12 hr capsule Take 1 capsule by mouth 2 (two) times daily. 60 capsule 6  . ketorolac (ACULAR) 0.5 % ophthalmic solution   0  . levETIRAcetam (KEPPRA) 500 MG tablet One tablet po qam and two tablets po qhs 60 tablet 6  . magnesium hydroxide (MILK OF MAGNESIA) 400 MG/5ML suspension Take by mouth daily as needed. Capful as needed constipation    . ofloxacin (OCUFLOX) 0.3 % ophthalmic solution     . Polyethyl Glycol-Propyl Glycol (SYSTANE ULTRA) 0.4-0.3 % SOLN Apply to eye 3 times daily.    . pravastatin (PRAVACHOL) 20 MG tablet Take 1 tablet (20 mg total) by mouth daily. 30 tablet 11  . prednisoLONE acetate (PRED FORTE) 1 % ophthalmic suspension     . rOPINIRole  (REQUIP) 1 MG tablet Take 1 tablet (1 mg total) by mouth 3 (three) times daily. 90 tablet 6  . White Petrolatum-Mineral Oil (PURALUBE) 85-15 % OINT Apply to eye.     No current facility-administered medications for this encounter.    Physical Findings: The patient is in no acute distress. Patient is alert and oriented.  weight is 196 lb (88.905 kg). His blood pressure is 138/65 and his pulse is 54. His respiration is 16 and oxygen saturation is 100%.    Head is [place] and AT.  Unchanged surgical mandible defect. Indirect mirror exam limited visualization no abnormality. He has no trismus. Within the oral cavity, there are no worrisome masses, mucosal lesions, or ulcerations. The flap appears to be well-seated. Neck exam is benign without any lymphadenopathy. No significant changes.  No significant changes.  Lab Findings: Lab Results  Component Value Date   WBC 5.1 11/23/2013   HGB 13.8 11/23/2013   HCT 41.2 11/23/2013   MCV 94.5 11/23/2013   PLT 256 11/23/2013    Impression:  The patient has no evidence of recurrence.   Plan:  Follow-up in one year. Draw a TSH today.  This document serves as a record of services personally performed by Tyler Pita, MD. It was created on his behalf by Jeralene Peters, a trained medical  scribe. The creation of this record is based on the scribe's personal observations and the provider's statements to them. This document has been checked and approved by the attending provider.      _____________________________________  Sheral Apley. Tammi Klippel, M.D.

## 2014-12-16 NOTE — Progress Notes (Signed)
Here for one year follow up with Dr. Tammi Klippel. TSH hasn't been drawn since last year. Weight and vitals stable. Denies pain or fatigue. Denies dry mouth. Reports thick rope like saliva is less. Denies pain associated with or difficulty swallowing. Denies ulcerations of sore of the oral mucosa. Denies ringing in the ears. Steady gait noted. Patient is HOH. Pleasant affect noted.

## 2014-12-16 NOTE — Telephone Encounter (Signed)
Per Dr. Johny Shears order called patient with normal TSH result. Spoke with wife, Lanae Boast. She verbalized understanding and expressed appreciation for the call.

## 2015-03-07 ENCOUNTER — Ambulatory Visit (INDEPENDENT_AMBULATORY_CARE_PROVIDER_SITE_OTHER): Payer: Medicare Other | Admitting: Internal Medicine

## 2015-03-07 ENCOUNTER — Encounter: Payer: Self-pay | Admitting: Internal Medicine

## 2015-03-07 VITALS — BP 129/65 | HR 52 | Temp 97.8°F | Ht 73.0 in | Wt 193.1 lb

## 2015-03-07 DIAGNOSIS — R569 Unspecified convulsions: Secondary | ICD-10-CM

## 2015-03-07 DIAGNOSIS — D139 Benign neoplasm of ill-defined sites within the digestive system: Secondary | ICD-10-CM | POA: Diagnosis not present

## 2015-03-07 DIAGNOSIS — D369 Benign neoplasm, unspecified site: Secondary | ICD-10-CM

## 2015-03-07 NOTE — Assessment & Plan Note (Signed)
Patient w/ adenomatous polyp on colonoscopy w/ Dr. Deatra Ina in 2013.  -Sent for GI referral for follow up colonoscopy per report

## 2015-03-07 NOTE — Progress Notes (Signed)
    Subjective:   Patient ID: Chase Berger male   DOB: 07-07-1933 81 y.o.   MRN: [id number]  HPI: [name] is a very pleasant 81 y.o. 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 Patient is accompanied by his daughter and wife today. He has no complaints. Denies any pain or discomfort. No SOB, chest pain, dizziness, palpitations, difficulty w/ ambulation, tremulousness, or recent seizures.   Current Outpatient Prescriptions  Medication Sig Dispense Refill  . cetirizine (ZYRTEC) 10 MG tablet Take 10 mg by mouth as needed for allergies.    Marland Kitchen dipyridamole-aspirin (AGGRENOX) 200-25 MG per 12 hr capsule Take 1 capsule by mouth 2 (two) times daily. 60 capsule 6  . ketorolac (ACULAR) 0.5 % ophthalmic solution   0  . levETIRAcetam (KEPPRA) 500 MG tablet One tablet po qam and two tablets po qhs 60 tablet 6  . magnesium hydroxide (MILK OF MAGNESIA) 400 MG/5ML suspension Take by mouth daily as needed. Capful as needed constipation    . ofloxacin (OCUFLOX) 0.3 % ophthalmic solution     . Polyethyl Glycol-Propyl Glycol (SYSTANE ULTRA) 0.4-0.3 % SOLN Apply to eye 3 times daily.    . pravastatin (PRAVACHOL) 20 MG tablet Take 1 tablet (20 mg total) by mouth daily. 30 tablet 11  . prednisoLONE acetate (PRED FORTE) 1 % ophthalmic suspension     . rOPINIRole (REQUIP) 1 MG tablet Take 1 tablet (1 mg total) by mouth 3 (three) times daily. 90 tablet 6  . White Petrolatum-Mineral Oil (PURALUBE) 85-15 % OINT Apply to eye.     No current facility-administered medications for this visit.   Review of Systems  General: Denies fever, diaphoresis, appetite change, and fatigue.  Respiratory: Denies SOB, cough, and wheezing.   Cardiovascular: Denies chest pain and palpitations.  Gastrointestinal: Denies nausea, vomiting, abdominal pain, and  diarrhea Musculoskeletal: Denies myalgias, arthralgias, back pain, and gait problem.  Neurological: Denies dizziness, syncope, weakness, lightheadedness, and headaches.  Psychiatric/Behavioral: Denies mood changes, sleep disturbance, and agitation.   Objective:  Physical Exam: Filed Vitals:   03/07/15 1436  BP: 129/65  Pulse: 52  Temp: 97.8 F (36.6 C)  TempSrc: Oral  Height: '6\' 1"'$  (1.854 m)  Weight: 193 lb 1.6 oz (87.59 kg)  SpO2: 100%   General: AA male, alert, cooperative, NAD. HEENT: PERRL, EOMI. Moist mucus membranes. Facial asymmetry s/p L. madibular resection in '09. Neck: Full range of motion without pain, supple, no lymphadenopathy or carotid bruits Lungs: Clear to ascultation bilaterally, normal work of respiration, no wheezes, rales, rhonchi Heart: RRR, no murmurs, gallops, or rubs Abdomen: Soft, non-tender, non-distended, BS + Extremities: No cyanosis, clubbing. Trace edema in LE's. Neurologic: Alert & oriented X3, cranial nerves II-XII intact, strength grossly intact, sensation intact to light touch. Mild resting tremor, more prominent on right side (almost unnoticeable today).    Assessment & Plan:   Please see problem based assessment and plan.

## 2015-03-07 NOTE — Patient Instructions (Signed)
1. Please make a hospital follow up for 1 year.   Please call Dr. Kelby Fam office to schedule a follow up appointment regarding Colonoscopy in 2013.   2. Please take all medications as previously prescribed.  3. If you have worsening of your symptoms or new symptoms arise, please call the clinic ([phone]), or go to the ER immediately if symptoms are severe.  You have done a great job in taking all your medications. Please continue to do this.

## 2015-03-11 NOTE — Addendum Note (Signed)
Addended by: Gilles Chiquito B on: 03/11/2015 02:34 PM   Modules accepted: Level of Service

## 2015-03-11 NOTE — Addendum Note (Signed)
Addended by: Hulan Fray on: 03/11/2015 05:28 PM   Modules accepted: Orders

## 2015-03-11 NOTE — Progress Notes (Signed)
Internal Medicine Clinic Attending  Case discussed with Dr. Jones soon after the resident saw the patient.  We reviewed the resident's history and exam and pertinent patient test results.  I agree with the assessment, diagnosis, and plan of care documented in the resident's note. 

## 2015-03-14 ENCOUNTER — Other Ambulatory Visit: Payer: Self-pay | Admitting: Nurse Practitioner

## 2015-05-16 ENCOUNTER — Ambulatory Visit: Payer: 59 | Admitting: Nurse Practitioner

## 2015-06-09 ENCOUNTER — Other Ambulatory Visit: Payer: Self-pay | Admitting: Nurse Practitioner

## 2015-07-13 ENCOUNTER — Telehealth: Payer: Self-pay | Admitting: Internal Medicine

## 2015-07-13 NOTE — Telephone Encounter (Signed)
Left message on ID recording for [facility] [phone].

## 2015-07-13 NOTE — Telephone Encounter (Signed)
Rec'd call from [facility] ,NP wanting to make sure his PCP is aware that this patient is in no Distress but has been drinking a Pint of Alcohol a day. Per Nurse "patient's wife unaware of his drinking a pint a day as he is hiding it".  Please call nurse back if you have any questions.

## 2015-07-14 NOTE — Telephone Encounter (Signed)
Debby return call to clinic Pt had a seizure on Thanksgiving. Aware that clinic will sch an appt  with PCP.

## 2015-07-15 NOTE — Telephone Encounter (Signed)
Daughter called - unable to bring pt at sch time. Daughter wanted 07/26/15 10:45AM - Dr Melburn Hake. Hilda Blades Paislee Szatkowski RN 07/15/15 10:AM

## 2015-07-26 ENCOUNTER — Encounter: Payer: Self-pay | Admitting: Internal Medicine

## 2015-07-26 ENCOUNTER — Encounter: Payer: Self-pay | Admitting: Licensed Clinical Social Worker

## 2015-07-26 ENCOUNTER — Ambulatory Visit (INDEPENDENT_AMBULATORY_CARE_PROVIDER_SITE_OTHER): Payer: Medicare Other | Admitting: Internal Medicine

## 2015-07-26 VITALS — BP 136/64 | HR 59 | Temp 97.5°F | Ht 72.0 in | Wt 196.6 lb

## 2015-07-26 DIAGNOSIS — F1099 Alcohol use, unspecified with unspecified alcohol-induced disorder: Secondary | ICD-10-CM

## 2015-07-26 DIAGNOSIS — Z23 Encounter for immunization: Secondary | ICD-10-CM | POA: Diagnosis not present

## 2015-07-26 DIAGNOSIS — Z0000 Encounter for general adult medical examination without abnormal findings: Secondary | ICD-10-CM | POA: Insufficient documentation

## 2015-07-26 DIAGNOSIS — IMO0002 Reserved for concepts with insufficient information to code with codable children: Secondary | ICD-10-CM

## 2015-07-26 MED ORDER — VARICELLA-ZOSTER IMMUNE GLOB 125 UNIT/1.2ML IM SOLN: 1.0000 | Freq: Once | INTRAMUSCULAR | Status: DC

## 2015-07-26 NOTE — Progress Notes (Signed)
Mr. Barlowe was referred to CSW to provide local substance abuse resources.  CSW met with Mr. Burgueno following his scheduled [facility] appointment.  Pt spouse and daughter were in the exam room and pt was agreeable to having family stay.  CSW discussed resources available through insurance and provided Mr. Malkiewicz with listing of local self-help, outpatient and inpatient resources.  [facility] is within pt's provider network and provides multiple levels of substance abuse services: outpatient, intensive outpatient, as well as outpatient detox.  CSW discussed the level of treatment and informed pt once assessment is completed a recommendation will be made.  It was unclear how motivated Mr. Wenner was to quitting or attending an assessment.  Pt was agreeable for CSW to provide the information and states he will look over.  Pt declined CSW scheduling an appointment at this time.   Daughter reiterated the need to be ready for treatment and inquired about cost.  CSW informed pt/family the assessment would be the in-network specialist copay and then to refer to the agency for the cost of the recommended course of treatment.  Transportation is a barrier, daughter states she works and would have difficulty transporting pt.  CSW discussed [facility] transportation and provided pt with the application, informational cover letter and stamped envelope to return to CSW for Part B.  Pt aware of cost for [facility] and need for interview as part of the application process.  CSW also provided Mr. Capshaw with information on [facility] as an additional support in the community.  Pt/family provided with CSW contact information for additional questions or information.  Family aware CSW is available to assist as needed.

## 2015-07-26 NOTE — Assessment & Plan Note (Addendum)
He has a history of alcoholism back in the mid 2000s, but quit in 2009 after his mandibular surgery. It appears he has been relapsing in the last few months, and his family is very concerned and wants to get him help before he starts down another slippery slope. He doesn't have any symptoms suggestive of depression or an underlying mood disorder. He doesn't appear to be intoxicated at this visit. He does have a history of seizures, but has never had seizures during withdrawal.   Fortunately, [facility] was here today, who provided the family with resources for counseling and detox. He is very open to getting help; hopefully, psychosocial intervention will nip this in the bud, but going forward, adding daily naltrexone could be a pharmacologic option to hinder his cravings.

## 2015-07-26 NOTE — Progress Notes (Signed)
Patient ID: Chase Berger, male   DOB: Apr 22, 1933, 81 y.o.   MRN: [id number] [facility] Subjective:   Patient ID: Chase Berger male   DOB: [birth year] 81 y.o.   MRN: [id number]  HPI: [name] is a 81 y.o. male with a PMH detailed below who presents for evaluation of alcohol use disorder.  His wife and daughter were present during this visit, and expressed sincere concern that he has started drinking again. The patient downplays his drinking, but does admit that he has had a problem with drinking in the past, and has started drinking again the last month or so. He was unable to give me any specific amount, but his wife thinks he is drinking about 1 pint of liquor per day. Back in 2006, he was in a 30 day rehabilitation program. He relapsed after that, but after his mandibular resection in 2009, he quit drinking altogether until just a few months ago. He says his mood has been "just fine", and is never struggled with depression in the past. He is aware that his drinking is dangerous, and is on a slippery slope to becoming an alcoholic again. I explained to him that he is at a very high fall risk as he is also taking Keppra, and has a history of seizures. He understands the risks, and is very interested in talking to a counselor. I spoke to Mrs. Chase Berger, who agreed to speak with him and talk about counseling options.  Past Medical History  Diagnosis Date  . Malignant neoplasm of intestinal tract, part unspecified ([place]) 09/17/2006    left mandibular sarcomatoid cancer treated in [place] in 2009 with radiation through 2010; last seen 06/2010 with no signs of deformity or progression  . 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 Allergic rhinitis 09/16/2008  . Hyperlipidemia 09/01/2008  . Urinary frequency 08/30/2008  . H/O: iron deficiency anemia 08/30/2008  . Malignant neoplasm of mandible ([place]) 07/07/2008    left  . CVA (cerebral infarction) 06/30/2007  .  Parkinson disease ([place]) 06/30/2007  . Smoker 06/24/2006  . Seizure disorder ([place]) 06/24/2006  . Peripheral vascular disease ([place]) 06/24/2006  . Stroke ([place])   . Hx of radiation therapy 04/12/09 -05/25/09    left infratemporal fossa   Current Outpatient Prescriptions  Medication Sig Dispense Refill  . cetirizine (ZYRTEC) 10 MG tablet Take 10 mg by mouth as needed for allergies.    Marland Kitchen dipyridamole-aspirin (AGGRENOX) 200-25 MG 12hr capsule take 1 capsule by mouth twice a day 60 capsule 2  . KEPPRA 500 MG tablet TAKE 1 TABLET BY MOUTH EVERY MORNING AND 2 TABLETS AT BEDTIME 90 tablet 2  . ketorolac (ACULAR) 0.5 % ophthalmic solution   0  . magnesium hydroxide (MILK OF MAGNESIA) 400 MG/5ML suspension Take by mouth daily as needed. Capful as needed constipation    . ofloxacin (OCUFLOX) 0.3 % ophthalmic solution     . Polyethyl Glycol-Propyl Glycol (SYSTANE ULTRA) 0.4-0.3 % SOLN Apply to eye 3 times daily.    . pravastatin (PRAVACHOL) 20 MG tablet Take 1 tablet (20 mg total) by mouth daily. 30 tablet 11  . prednisoLONE acetate (PRED FORTE) 1 % ophthalmic suspension     . rOPINIRole (REQUIP) 1 MG tablet Take 1 tablet (1 mg total) by mouth 3 (three) times daily. 90 tablet 6  . White Petrolatum-Mineral Oil (PURALUBE) 85-15 % OINT Apply to eye.     No current facility-administered medications for this visit.  Family History  Problem Relation Age of Onset  . Heart disease Mother   . Heart attack Mother   . Heart disease Father   . Heart attack Father   . Colon cancer Neg Hx    Social History   Social History  . Marital Status: Married    Spouse Name: Chase Berger  . Number of Children: 6  . Years of Education: HS   Occupational History  . Retired    Social History Main Topics  . Smoking status: Former Smoker -- 50 years    Types: Cigarettes    Quit date: 11/12/2007  . Smokeless tobacco: Former Systems developer    Quit date: 10/01/1999  . Alcohol Use: No  . Drug Use: No  . Sexual Activity: Not Asked    Other Topics Concern  . None   Social History Narrative   Patient lives at home with spouse. ( Chase Berger ).   Retired.   Education high school.   Right handed.   Caffeine two cups daily of coffee.   Review of Systems  Constitutional: Negative for fever, chills and weight loss.  Cardiovascular: Negative for chest pain.  Genitourinary: Negative for dysuria and urgency.  Musculoskeletal: Negative for myalgias.  Neurological: Negative for dizziness, loss of consciousness and headaches.  Psychiatric/Behavioral: Positive for substance abuse. Negative for depression and suicidal ideas. The patient is not nervous/anxious.    Objective:  Physical Exam: Filed Vitals:   07/26/15 1048  BP: 136/64  Pulse: 59  Temp: 97.5 F (36.4 C)  TempSrc: Oral  Height: 6' (1.829 m)  Weight: 196 lb 9.6 oz (89.177 kg)  SpO2: 100%   General: resting in bed comfortably, appropriately conversational HEENT: no scleral icterus, extra-ocular muscles intact, oropharynx without lesions Cardiac: regular rate and rhythm, no rubs, murmurs or gallops Pulm: breathing well, clear to auscultation bilaterally Abd: bowel sounds normal, soft, nondistended, non-tender, no signs of ascites or hepatomegaly Ext: warm and well perfused, without 1+ pitting pedal edema Lymph: no cervical or supraclavicular lymphadenopathy Skin: no rash, hair, or nail changes Neuro: alert and oriented X3, subtle right-sided tremor, cranial nerves II-XII grossly intact, moving all extremities well  Assessment & Plan:  Case discussed with Dr. Daryll Drown  Alcohol use disorder [facility]) He has a history of alcoholism back in the mid 2000s, but quit in 2009 after his mandibular surgery. It appears he has been relapsing in the last few months, and his family is very concerned and wants to get him help before he starts down another slippery slope. Fortunately, Chase Berger was here today, who provided the family with resources for counseling and detox. He doesn't  appear to be intoxicated at this visit. He does have a history of seizures, but has never had seizures during withdrawal. Fortunately, he is very open to getting help; hopefully, psychosocial intervention will nip this in the bud, but going forward, adding daily naltrexone could be a pharmacologic option to hinder his cravings.  Healthcare maintenance Administered pneumococcal 13 valent vaccine today, and printed a prescription for a zoster vaccine per the family's request. I've also provided him with stool cards for colorectal cancer screening.   Medications Ordered Meds ordered this encounter  Medications  . Varicella-Zoster Immune Glob 125 UNIT/1.2ML SOLN    Sig: Inject 1 ampule into the muscle once.    Dispense:  1 vial    Refill:  0   Other Orders Orders Placed This Encounter  Procedures  . Pneumococcal conjugate vaccine 13-valent  . Ambulatory referral to Social  Work    Referral Priority:  Routine    Referral Type:  Consultation    Referral Reason:  Specialty Services Required    Number of Visits Requested:  1  . POC Hemoccult Bld/Stl (3-Cd Home Screen)    Standing Status: Future     Number of Occurrences:      Standing Expiration Date: 07/25/2016   Follow Up: Return in about 4 weeks (around 08/23/2015).

## 2015-07-26 NOTE — Patient Instructions (Signed)
Chase Berger,  It was a pleasure meeting you today.  Very sorry to hear you've had such a difficult time with alcohol in the past. It sounds like your family loves you very much, and is quite concerned about this. Fortunately, we have an excellent social worker named Edwena Blow who will help get you plugged into counseling.  Like we discussed, drinking a your age and taking the [place] medication is very dangerous, but she had a very high risk for falls, in addition to liver disease. We will be here for you if you need any more assistance.  Take care, and I hope you and your family have a most merry Christmas, Dr. Melburn Hake

## 2015-07-26 NOTE — Assessment & Plan Note (Signed)
Administered pneumococcal 13 valent vaccine today, and printed a prescription for a zoster vaccine per the family's request. I've also provided him with stool cards for colorectal cancer screening.

## 2015-08-01 NOTE — Progress Notes (Signed)
Internal Medicine Clinic Attending  I saw and evaluated the patient.  I personally confirmed the key portions of the history and exam documented by Dr. Flores and I reviewed pertinent patient test results.  The assessment, diagnosis, and plan were formulated together and I agree with the documentation in the resident's note.  

## 2015-08-09 ENCOUNTER — Ambulatory Visit: Payer: Medicare Other | Admitting: Internal Medicine

## 2015-08-11 ENCOUNTER — Other Ambulatory Visit (INDEPENDENT_AMBULATORY_CARE_PROVIDER_SITE_OTHER): Payer: Medicare Other

## 2015-08-11 DIAGNOSIS — Z0000 Encounter for general adult medical examination without abnormal findings: Secondary | ICD-10-CM

## 2015-08-11 DIAGNOSIS — Z1211 Encounter for screening for malignant neoplasm of colon: Secondary | ICD-10-CM

## 2015-08-11 LAB — POC HEMOCCULT BLD/STL (HOME/3-CARD/SCREEN)
Card #2 Fecal Occult Blod, POC: NEGATIVE
FECAL OCCULT BLD: NEGATIVE
Fecal Occult Blood, POC: NEGATIVE

## 2015-08-22 ENCOUNTER — Ambulatory Visit: Payer: 59 | Admitting: Nurse Practitioner

## 2015-08-23 ENCOUNTER — Telehealth: Payer: Self-pay | Admitting: *Deleted

## 2015-08-23 NOTE — Telephone Encounter (Signed)
I called and spoke to wife to reschedule appt for another day.   She stated she would have to speak to daughter and they will call us back.

## 2015-08-30 ENCOUNTER — Ambulatory Visit (INDEPENDENT_AMBULATORY_CARE_PROVIDER_SITE_OTHER): Payer: Medicare Other | Admitting: Nurse Practitioner

## 2015-08-30 ENCOUNTER — Encounter: Payer: Self-pay | Admitting: Nurse Practitioner

## 2015-08-30 VITALS — BP 124/72 | HR 52 | Ht 72.0 in | Wt 193.6 lb

## 2015-08-30 DIAGNOSIS — R5601 Complex febrile convulsions: Secondary | ICD-10-CM | POA: Diagnosis not present

## 2015-08-30 DIAGNOSIS — G20 Parkinson's disease: Secondary | ICD-10-CM

## 2015-08-30 DIAGNOSIS — I6350 Cerebral infarction due to unspecified occlusion or stenosis of unspecified cerebral artery: Secondary | ICD-10-CM

## 2015-08-30 DIAGNOSIS — G20A1 Parkinson's disease without dyskinesia, without mention of fluctuations: Secondary | ICD-10-CM

## 2015-08-30 MED ORDER — ASPIRIN-DIPYRIDAMOLE ER 25-200 MG PO CP12: 1.0000 | ORAL_CAPSULE | Freq: Two times a day (BID) | ORAL | Status: DC

## 2015-08-30 MED ORDER — KEPPRA 500 MG PO TABS: ORAL_TABLET | ORAL | Status: DC

## 2015-08-30 MED ORDER — ROPINIROLE HCL 1 MG PO TABS: 1.0000 mg | ORAL_TABLET | Freq: Three times a day (TID) | ORAL | Status: DC

## 2015-08-30 NOTE — Patient Instructions (Signed)
Continue Aggrenox for secondary stroke prevention, will refill Continue Keppra for seizure prevention, will refill Call for any seizure activity Continue Requip for parkinsons disease three times daily Be careful with ambulation due to risk for falls F/U in one year next with Dr. Krista Blue

## 2015-08-30 NOTE — Progress Notes (Signed)
[facility]  PATIENT: Chase Berger DOB: 12/22/1932   REASON FOR VISIT: Follow-up for seizure disorder, Parkinson's disease, history of stroke HISTORY FROM: Patient and wife    HISTORY OF PRESENT ILLNESS:Mr.80, 82 year old male returns for followup. He has a history of remote stroke, Parkinson's disease, and seizure disorder.He is currently on Keppra Brand. He has had break through seizures on generic in the past. He has had one seizure since last seen he was out of town at [employer] and did not take his medication on time. He is on Aggrenox twice a day for stroke prevention, and Requip for his minor Parkinson's symptoms. 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  He is doing well, independent in daily living. Remains on soft foods and weight is stable. Some difficulty with swallowing at times. No falls . He has not had  symptoms of stroke or TIA since last seen . His Parkinson's symptoms are stable No new neurological complaints.    REVIEW OF SYSTEMS: Full 14 system review of systems performed and notable only for those listed, all others are neg:  Constitutional: neg  Cardiovascular: neg Ear/Nose/Throat: neg  Skin: neg Eyes: neg Respiratory: neg Gastroitestinal: neg  Hematology/Lymphatic: neg  Endocrine: neg Musculoskeletal:neg Allergy/Immunology: neg Neurological: neg Psychiatric: neg Sleep : neg   ALLERGIES: No Known Allergies  HOME MEDICATIONS: Outpatient Prescriptions Prior to Visit  Medication Sig Dispense Refill  . cetirizine (ZYRTEC) 10 MG tablet Take 10 mg by mouth as needed for allergies.    Marland Kitchen dipyridamole-aspirin (AGGRENOX) 200-25 MG 12hr capsule take 1 capsule by mouth twice a day 60 capsule 2  . KEPPRA 500 MG tablet TAKE  1 TABLET BY MOUTH EVERY MORNING AND 2 TABLETS AT BEDTIME 90 tablet 2  . magnesium hydroxide (MILK OF MAGNESIA) 400 MG/5ML suspension Take by mouth daily as needed. Capful as needed constipation    . pravastatin (PRAVACHOL) 20 MG tablet Take 1 tablet (20 mg total) by mouth daily. 30 tablet 11  . rOPINIRole (REQUIP) 1 MG tablet Take 1 tablet (1 mg total) by mouth 3 (three) times daily. 90 tablet 6  . Varicella-Zoster Immune Glob 125 UNIT/1.2ML SOLN Inject 1 ampule into the muscle once. 1 vial 0  . ketorolac (ACULAR) 0.5 % ophthalmic solution Reported on 08/30/2015  0  . ofloxacin (OCUFLOX) 0.3 % ophthalmic solution Reported on 08/30/2015    . Polyethyl Glycol-Propyl Glycol (SYSTANE ULTRA) 0.4-0.3 % SOLN Reported on 08/30/2015    . prednisoLONE acetate (PRED FORTE) 1 % ophthalmic suspension Reported on 08/30/2015    . White Petrolatum-Mineral Oil (PURALUBE) 85-15 % OINT Apply to eye. Reported on 08/30/2015     No facility-administered medications prior to visit.    PAST MEDICAL HISTORY: Past Medical History  Diagnosis Date  . Malignant neoplasm of intestinal tract, part unspecified ([place]) 09/17/2006    left mandibular sarcomatoid cancer treated in [place] in 2009 with radiation through 2010; last seen 06/2010 with no signs of deformity or progression  . 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 Allergic rhinitis 09/16/2008  . Hyperlipidemia 09/01/2008  . Urinary frequency 08/30/2008  . H/O: iron deficiency anemia 08/30/2008  . Malignant neoplasm of mandible ([place]) 07/07/2008    left  . CVA (cerebral infarction) 06/30/2007  . Parkinson disease (  [place]) 06/30/2007  . Smoker 06/24/2006  . Seizure disorder ([place]) 06/24/2006  . Peripheral vascular disease ([place]) 06/24/2006  . Stroke ([place])   . Hx of radiation therapy 04/12/09 -05/25/09    left infratemporal fossa    PAST SURGICAL HISTORY: Past Surgical History  Procedure Laterality Date  . Cancer removed from jaw      FAMILY HISTORY: Family  History  Problem Relation Age of Onset  . Heart disease Mother   . Heart attack Mother   . Heart disease Father   . Heart attack Father   . Colon cancer Neg Hx     SOCIAL HISTORY: Social History   Social History  . Marital Status: Married    Spouse Name: [name]  . Number of Children: 6  . Years of Education: HS   Occupational History  . Retired    Social History Main Topics  . Smoking status: Former Smoker -- 50 years    Types: Cigarettes    Quit date: 11/12/2007  . Smokeless tobacco: Former Systems developer    Quit date: 10/01/1999  . Alcohol Use: No  . Drug Use: No  . Sexual Activity: Not on file   Other Topics Concern  . Not on file   Social History Narrative   Patient lives at home with spouse. ( [name] ).   Retired.   Education high school.   Right handed.   Caffeine two cups daily of coffee.     PHYSICAL EXAM  Filed Vitals:   08/30/15 1424  BP: 124/72  Pulse: 52  Height: 6' (1.829 m)  Weight: 193 lb 9.6 oz (87.816 kg)   Body mass index is 26.25 kg/(m^2). Generalized: Well developed, in no acute distress  Head: Distorted left mandibular and cheek region due to surgical resection, oropharynx benign Neck: Supple, no carotid bruits  Cardiac: Regular rate rhythm, no murmur  Musculoskeletal: No deformity   Neurological examination   Mentation: Alert oriented to time, place, history taking. Follows all commands speech and language fluent  Cranial nerve II-XII: Pupils were equal round reactive to light extraocular movements were full, visual field were full on confrontational test. 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 normal. hearing was intact to finger rubbing bilaterally. Uvula tongue midline. head turning and shoulder shrug were normal and symmetric.Tongue protrusion into cheek strength was normal. Motor: normal bulk and tone, full strength in the BUE, BLE, fine finger  movements normal, no pronator drift. No focal weakness, no cogwheeling,  resting tremor mild on the right . Sensory: normal and symmetric to light touch, pinprick, and vibration  Coordination: finger-nose-finger, heel-to-shin bilaterally, no dysmetria Reflexes: Brachioradialis 2/2, biceps 2/2, triceps 2/2, patellar 2/2, Achilles 2/2, plantar responses were flexor bilaterally. Gait and Station: Rising up from seated position without assistance, normal stance, moderate stride, good arm swing, smooth turning, able to perform tiptoe, and heel walking without difficulty. No assistive device   DIAGNOSTIC DATA (LABS, IMAGING, TESTING) -  Lab Results  Component Value Date   TSH 0.776 12/16/2014      ASSESSMENT AND PLAN  82 y.o. year old male  has a past medical history of iron deficiency anemia (08/30/2008); Malignant neoplasm of mandible ([place]) (07/07/2008); CVA (cerebral infarction) (06/30/2007); Parkinson disease ([place]) (06/30/2007);  Seizure disorder ([place]) (06/24/2006); Peripheral vascular disease ([place]) (06/24/2006); Stroke [facility]); and radiation therapy (04/12/09 -05/25/09). here to follow-up for his Parkinson's disease, stroke and seizure disorder  Continue  Aggrenox for secondary stroke prevention, will refill Continue Keppra for seizure prevention, will refill Call for any seizure activity Continue Requip for parkinsons disease three times daily Be careful with ambulation due to risk for falls Dennie Bible, [facility], [facility], APRN  [facility] [address], [place], [place] [address] [phone]

## 2015-08-31 NOTE — Progress Notes (Signed)
I have reviewed and agreed above plan. 

## 2015-10-05 ENCOUNTER — Other Ambulatory Visit: Payer: Self-pay | Admitting: Internal Medicine

## 2015-10-07 ENCOUNTER — Other Ambulatory Visit: Payer: Self-pay | Admitting: Internal Medicine

## 2015-10-07 NOTE — Telephone Encounter (Signed)
Already pending with surescripts

## 2015-10-07 NOTE — Telephone Encounter (Signed)
Requesting pravastatin to be filled @ [facility] on [address].

## 2015-12-22 ENCOUNTER — Encounter: Payer: Self-pay | Admitting: Radiation Oncology

## 2015-12-22 ENCOUNTER — Ambulatory Visit
Admission: RE | Admit: 2015-12-22 | Discharge: 2015-12-22 | Disposition: A | Discharge status: Home / Self Care | Payer: Medicare Other | Source: Ambulatory Visit | Attending: Radiation Oncology | Admitting: Radiation Oncology

## 2015-12-22 ENCOUNTER — Ambulatory Visit (HOSPITAL_BASED_OUTPATIENT_CLINIC_OR_DEPARTMENT_OTHER)
Admission: RE | Admit: 2015-12-22 | Discharge: 2015-12-22 | Disposition: A | Discharge status: Home / Self Care | Payer: Medicare Other | Source: Ambulatory Visit | Attending: Radiation Oncology | Admitting: Radiation Oncology

## 2015-12-22 ENCOUNTER — Telehealth: Payer: Self-pay | Admitting: Radiation Oncology

## 2015-12-22 VITALS — BP 136/60 | HR 61 | Resp 16 | Wt 199.8 lb

## 2015-12-22 DIAGNOSIS — Z862 Personal history of diseases of the blood and blood-forming organs and certain disorders involving the immune mechanism: Secondary | ICD-10-CM | POA: Diagnosis not present

## 2015-12-22 DIAGNOSIS — C411 Malignant neoplasm of mandible: Secondary | ICD-10-CM

## 2015-12-22 DIAGNOSIS — Z923 Personal history of irradiation: Secondary | ICD-10-CM

## 2015-12-22 LAB — TSH: TSH: 0.913 m[IU]/L (ref 0.320–4.118)

## 2015-12-22 NOTE — Telephone Encounter (Signed)
Per Alison's request phoned patient with normal results of TSH. Spoke with patient's wife. She verbalized understanding.

## 2015-12-22 NOTE — Progress Notes (Signed)
Patient here with wife and daughter for yearly follow up with Dr. Tammi Klippel. Patient seen last at [facility] on 08/30/15 but, no notes are showing in care everywhere for this encounter. Patient's last TSH was drawn 1 year ago and proved to be 0.776. Left eye puffiness and tearing noted. Redness noted of left eye as well. Patient reports nasal congestion. Denies dry mouth. Denies sores or ulcerations of the oral mucosa. Reports thick ropey saliva continues. Denies ringing of the ears. Patient is HOH. Steady gait noted. Denies fatigue.   BP 136/60 mmHg  Pulse 61  Resp 16  Wt 199 lb 12.8 oz (90.629 kg)  SpO2 100% Wt Readings from Last 3 Encounters:  12/22/15 199 lb 12.8 oz (90.629 kg)  08/30/15 193 lb 9.6 oz (87.816 kg)  07/26/15 196 lb 9.6 oz (89.177 kg)

## 2015-12-22 NOTE — Progress Notes (Signed)
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 He was seen last at [facility] on 08/30/15 but, no notes are showing in care everywhere for this encounter. Patient's last TSH was drawn 1 year ago and proved to be 0.776. Left eye puffiness, redness, and tearing are noted. He  reports nasal congestion. He denies dry mouth as well as any sores or ulcerations of the oral mucosa. He reports that thick ropey saliva continues to be an issue for him. He denies any ringing of the ears as well as any fatigue. Patient is HOH. Steady gait is noted. He is accompanied by his wife and daughter today.  Meds: Current Outpatient Prescriptions  Medication Sig Dispense Refill  . Artificial Tear Ointment (ARTIFICIAL TEARS) ointment as needed. Both eyes,  Uses gtts as well.    . cetirizine (ZYRTEC) 10 MG tablet Take 10 mg by mouth as needed for allergies.    Marland Kitchen dipyridamole-aspirin (AGGRENOX) 200-25 MG 12hr capsule Take 1 capsule by mouth 2 (two) times daily. 60 capsule 11  . KEPPRA 500 MG tablet TAKE 1 TABLET BY MOUTH EVERY MORNING AND 2 TABLETS AT BEDTIME 90 tablet 11  . magnesium hydroxide (MILK OF MAGNESIA) 400 MG/5ML suspension Take by mouth daily as needed. Capful as needed constipation    . pravastatin (PRAVACHOL) 20 MG tablet take 1 tablet by mouth once daily 30 tablet 11  . rOPINIRole (REQUIP) 1 MG tablet Take 1 tablet (1 mg total) by mouth 3  (three) times daily. 90 tablet 11  . Varicella-Zoster Immune Glob 125 UNIT/1.2ML SOLN Inject 1 ampule into the muscle once. 1 vial 0   No current facility-administered medications for this encounter.    Physical Findings:  weight is 199 lb 12.8 oz (90.629 kg). His blood pressure is 136/60 and his pulse is 61. His respiration is 16 and oxygen saturation is 100%.    In general, this is a well appearing African American male in no acute distress. He is alert and oriented x4 and appropriate throughout the examination.  Head is [place] and AT. Unchanged surgical mandible defect. He has no trismus. Within the oral cavity, there are no worrisome masses, mucosal lesions, or ulcerations. The flap appears to be well-seated. Neck exam is benign without any lymphadenopathy. No significant changes.  Lab Findings: Lab Results  Component Value Date   WBC 5.1 11/23/2013   HGB 13.8 11/23/2013   HCT 41.2 11/23/2013   MCV 94.5 11/23/2013   PLT 256 11/23/2013    Impression:  The patient has no evidence of recurrence.   Plan:  The patient appears to be NED at this time. Will order TSH draw for today. He will continue to follow up with survivorship. I will see him PRN.   _____________________________________  Sheral Apley Tammi Klippel, M.D.  This document serves as a record of services personally performed by Shona Simpson, PA and Tyler Pita, MD. It was created  on their behalf by Jenell Milliner, a trained medical scribe. The creation of this record is based on the scribe's personal observations and the provider's statements to them. This document has been checked and approved by the attending provider.

## 2015-12-22 NOTE — Telephone Encounter (Signed)
-----   Message from Hayden Pedro, [place] sent at 12/22/2015  1:42 PM EDT ----- Will you let them know his TSH was normal? ----- Message -----    From: [facility] Interface    Sent: 12/22/2015  12:30 PM      To: Hayden Pedro, PA-C

## 2016-04-09 DIAGNOSIS — R7302 Impaired glucose tolerance (oral): Secondary | ICD-10-CM | POA: Diagnosis not present

## 2016-04-09 DIAGNOSIS — I2510 Atherosclerotic heart disease of native coronary artery without angina pectoris: Secondary | ICD-10-CM | POA: Diagnosis not present

## 2016-04-09 DIAGNOSIS — Z125 Encounter for screening for malignant neoplasm of prostate: Secondary | ICD-10-CM | POA: Diagnosis not present

## 2016-04-09 DIAGNOSIS — M2550 Pain in unspecified joint: Secondary | ICD-10-CM | POA: Diagnosis not present

## 2016-04-09 DIAGNOSIS — I10 Essential (primary) hypertension: Secondary | ICD-10-CM | POA: Diagnosis not present

## 2016-05-07 DIAGNOSIS — E7800 Pure hypercholesterolemia, unspecified: Secondary | ICD-10-CM | POA: Diagnosis not present

## 2016-05-07 DIAGNOSIS — I2510 Atherosclerotic heart disease of native coronary artery without angina pectoris: Secondary | ICD-10-CM | POA: Diagnosis not present

## 2016-05-07 DIAGNOSIS — I10 Essential (primary) hypertension: Secondary | ICD-10-CM | POA: Diagnosis not present

## 2016-05-08 DIAGNOSIS — Z961 Presence of intraocular lens: Secondary | ICD-10-CM | POA: Diagnosis not present

## 2016-05-08 DIAGNOSIS — H04222 Epiphora due to insufficient drainage, left lacrimal gland: Secondary | ICD-10-CM | POA: Diagnosis not present

## 2016-05-08 DIAGNOSIS — H35373 Puckering of macula, bilateral: Secondary | ICD-10-CM | POA: Diagnosis not present

## 2016-05-08 DIAGNOSIS — H35033 Hypertensive retinopathy, bilateral: Secondary | ICD-10-CM | POA: Diagnosis not present

## 2016-06-14 ENCOUNTER — Encounter: Payer: Medicare Other | Admitting: Adult Health

## 2016-06-18 ENCOUNTER — Encounter: Payer: Medicare Other | Admitting: Adult Health

## 2016-07-02 DIAGNOSIS — L602 Onychogryphosis: Secondary | ICD-10-CM | POA: Diagnosis not present

## 2016-07-02 DIAGNOSIS — M2042 Other hammer toe(s) (acquired), left foot: Secondary | ICD-10-CM | POA: Diagnosis not present

## 2016-08-26 ENCOUNTER — Other Ambulatory Visit: Payer: Self-pay | Admitting: Nurse Practitioner

## 2016-08-28 ENCOUNTER — Ambulatory Visit (INDEPENDENT_AMBULATORY_CARE_PROVIDER_SITE_OTHER): Payer: Medicare Other | Admitting: Neurology

## 2016-08-28 ENCOUNTER — Other Ambulatory Visit: Payer: Self-pay | Admitting: *Deleted

## 2016-08-28 ENCOUNTER — Telehealth: Payer: Self-pay | Admitting: Neurology

## 2016-08-28 ENCOUNTER — Encounter: Payer: Self-pay | Admitting: Neurology

## 2016-08-28 ENCOUNTER — Other Ambulatory Visit: Payer: Self-pay | Admitting: Internal Medicine

## 2016-08-28 VITALS — BP 133/68 | HR 63 | Ht 72.0 in | Wt 194.5 lb

## 2016-08-28 DIAGNOSIS — I6350 Cerebral infarction due to unspecified occlusion or stenosis of unspecified cerebral artery: Secondary | ICD-10-CM

## 2016-08-28 DIAGNOSIS — G20 Parkinson's disease: Secondary | ICD-10-CM | POA: Diagnosis not present

## 2016-08-28 DIAGNOSIS — C411 Malignant neoplasm of mandible: Secondary | ICD-10-CM | POA: Diagnosis not present

## 2016-08-28 DIAGNOSIS — R569 Unspecified convulsions: Secondary | ICD-10-CM | POA: Diagnosis not present

## 2016-08-28 MED ORDER — KEPPRA 500 MG PO TABS: ORAL_TABLET | ORAL | 4 refills | Status: DC

## 2016-08-28 MED ORDER — ROPINIROLE HCL 1 MG PO TABS: 1.0000 mg | ORAL_TABLET | Freq: Three times a day (TID) | ORAL | 4 refills | Status: DC

## 2016-08-28 MED ORDER — ASPIRIN-DIPYRIDAMOLE ER 25-200 MG PO CP12: 1.0000 | ORAL_CAPSULE | Freq: Two times a day (BID) | ORAL | 11 refills | Status: DC

## 2016-08-28 MED ORDER — ASPIRIN-DIPYRIDAMOLE ER 25-200 MG PO CP12: 1.0000 | ORAL_CAPSULE | Freq: Two times a day (BID) | ORAL | 3 refills | Status: DC

## 2016-08-28 NOTE — Telephone Encounter (Signed)
Patient's daughter Levada Dy is calling requesting refill for dipyridamole-aspirin (AGGRENOX) 200-25 MG 12hr capsule.  Patient was seen today pharmacy states they do not have a prescription for it.  Please call

## 2016-08-28 NOTE — Telephone Encounter (Signed)
Prescription has been sent to the pharmacy. 

## 2016-08-28 NOTE — Progress Notes (Signed)
[facility]  PATIENT: Chase Berger DOB: [birth year]   REASON FOR VISIT: Follow-up for seizure disorder, Parkinson's disease, history of stroke HISTORY FROM: Patient and wife    HISTORY OF PRESENT ILLNESS:Mr.81, 83 year old male returns for followup. He has a history of remote stroke, Parkinson's disease, and seizure disorder.He is currently on Keppra Brand. He has had break through seizures on generic in the past. He has had one seizure since last seen he was out of town at [employer] in 2016 and did not take his medication on time. He is on Aggrenox twice a day for stroke prevention, and Requip for his minor Parkinson's symptoms. 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  He is doing well, independent in daily living. Remains on soft foods and weight is stable. Some difficulty with swallowing at times. No falls . He has not had  symptoms of stroke or TIA since last seen . His Parkinson's symptoms are stable No new neurological complaints.   UPDATE Jan 16th 2018: He always keep handkerchife, due to drooling,   I reviewed [facility] record, he had a history of left mandibular, maxillary, infratentorial fossa sarcomatoid carcinoma status post surgery in April of 2009, then radiation therapy in September of 2009, followed by re-radiation therapy in October 2010, there was no recurrent tumor on following up visit.  I was able to review MRI of the brain record in July 04, 2001, evidence of intracranial atherosclerotic disease, evidence of chronic right temporal, left occipital lobe stroke,  His memory is fine.  He walks reguarly, next to do word puzzle, word search, he watches TV, he sleeps fine, has good appetite     REVIEW OF SYSTEMS: Full 14 system  review of systems performed and notable only for those listed, all others are neg: As above    ALLERGIES: No Known Allergies  HOME MEDICATIONS: Outpatient Medications Prior to Visit  Medication Sig Dispense Refill  . Artificial Tear Ointment (ARTIFICIAL TEARS) ointment as needed. Both eyes,  Uses gtts as well.    . cetirizine (ZYRTEC) 10 MG tablet Take 10 mg by mouth as needed for allergies.    Marland Kitchen dipyridamole-aspirin (AGGRENOX) 200-25 MG 12hr capsule Take 1 capsule by mouth 2 (two) times daily. 60 capsule 11  . KEPPRA 500 MG tablet TAKE 1 TABLET BY MOUTH EVERY MORNING AND 2 TABLETS AT BEDTIME 90 tablet 11  . magnesium hydroxide (MILK OF MAGNESIA) 400 MG/5ML suspension Take by mouth daily as needed. Capful as needed constipation    . pravastatin (PRAVACHOL) 20 MG tablet take 1 tablet by mouth once daily 30 tablet 11  . rOPINIRole (REQUIP) 1 MG tablet Take 1 tablet (1 mg total) by mouth 3 (three) times daily. 90 tablet 11  . Varicella-Zoster Immune Glob 125 UNIT/1.2ML SOLN Inject 1 ampule into the muscle once. 1 vial 0   No facility-administered medications prior to visit.     PAST MEDICAL HISTORY: Past Medical History:  Diagnosis Date  . Allergic rhinitis 09/16/2008  . CVA (cerebral infarction) 06/30/2007  . H/O: iron deficiency anemia 08/30/2008  . Hx of radiation therapy 04/12/09 -05/25/09   left infratemporal fossa  . Hyperlipidemia 09/01/2008  . Malignant neoplasm of intestinal tract, part unspecified 09/17/2006   left mandibular sarcomatoid cancer treated in  [facility] in 2009 with radiation through 2010; last seen 06/2010 with no signs of deformity or progression  . Malignant neoplasm of mandible ([place]) 07/07/2008   left  . Parkinson disease ([place]) 06/30/2007  . Peripheral vascular disease ([place]) 06/24/2006  . Seizure disorder ([place]) 06/24/2006  . Smoker 06/24/2006  . Stroke ([place])   . 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 Urinary frequency 08/30/2008    PAST SURGICAL  HISTORY: Past Surgical History:  Procedure Laterality Date  . cancer removed from jaw      FAMILY HISTORY: Family History  Problem Relation Age of Onset  . Heart disease Mother   . Heart attack Mother   . Heart disease Father   . Heart attack Father   . Colon cancer Neg Hx     SOCIAL HISTORY: Social History   Social History  . Marital status: Married    Spouse name: [name]  . Number of children: 6  . Years of education: HS   Occupational History  . Retired Retired   Social History Main Topics  . Smoking status: Former Smoker    Years: 50.00    Types: Cigarettes    Quit date: 11/12/2007  . Smokeless tobacco: Former Systems developer    Quit date: 10/01/1999  . Alcohol use No  . Drug use: No  . Sexual activity: Not on file   Other Topics Concern  . Not on file   Social History Narrative   Patient lives at home with spouse. ( [name] ).   Retired.   Education high school.   Right handed.   Caffeine two cups daily of coffee.     PHYSICAL EXAM  Vitals:   08/28/16 1449  BP: 133/68  Pulse: 63  Weight: 194 lb 8 oz (88.2 kg)  Height: 6' (1.829 m)   Body mass index is 26.38 kg/m. Generalized: Well developed, in no acute distress  Head: Distorted left mandibular and cheek region due to surgical resection, oropharynx benign Neck: Supple, no carotid bruits  Cardiac: Regular rate rhythm, no murmur  Musculoskeletal: No deformity   Neurological examination   Mentation: Alert oriented to time, place, history taking. Follows all commands speech and language fluent  Cranial nerve II-XII: Pupils were equal round reactive to light extraocular movements were full, visual field were full on confrontational test. 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 normal. hearing was intact to finger rubbing bilaterally. Uvula tongue midline. head turning and shoulder shrug were normal and symmetric.Tongue  protrusion into cheek strength was normal. Motor: normal bulk and tone, full strength in the BUE, BLE, fine finger movements normal, no pronator drift. No focal weakness, no cogwheeling,  resting tremor mild on the right . Sensory: normal and symmetric to light touch, pinprick, and vibration  Coordination: finger-nose-finger, heel-to-shin bilaterally, no dysmetria Reflexes: Brachioradialis 2/2, biceps 2/2, triceps 2/2, patellar 2/2, Achilles 2/2, plantar responses were flexor bilaterally. Gait and Station: Rising up from seated position without assistance, normal stance, moderate stride, good arm swing, smooth turning, able to perform tiptoe, and heel walking without difficulty. No assistive device   DIAGNOSTIC DATA (LABS, IMAGING, TESTING) -  Lab Results  Component Value Date   TSH 0.913 12/22/2015      ASSESSMENT AND PLAN  83 y.o. year old male  History of stroke  Continue Aggrenox 1 tablet twice a day  Complex partial seizure with secondary generalization  Continue Keppra 500 mg  twice a day  Mild parkinsonian features,  I was considering tapering off Requip, family stated that it has helped his tremor, will continue low dose 1 mg tid   Marcial Pacas, M.D. Ph.D.  [facility] [place], [place] [address] Phone: [phone] Fax:      [phone]

## 2016-09-03 ENCOUNTER — Ambulatory Visit (HOSPITAL_BASED_OUTPATIENT_CLINIC_OR_DEPARTMENT_OTHER): Payer: Medicare Other | Admitting: Adult Health

## 2016-09-03 ENCOUNTER — Encounter: Payer: Self-pay | Admitting: Adult Health

## 2016-09-03 VITALS — BP 129/62 | HR 49 | Temp 97.9°F | Resp 18 | Ht 72.0 in | Wt 196.4 lb

## 2016-09-03 DIAGNOSIS — Z8589 Personal history of malignant neoplasm of other organs and systems: Secondary | ICD-10-CM | POA: Diagnosis not present

## 2016-09-03 DIAGNOSIS — C411 Malignant neoplasm of mandible: Secondary | ICD-10-CM

## 2016-09-03 NOTE — Progress Notes (Signed)
CLINIC: Survivorship  REASON FOR VISIT: Routine follow up for head and neck cancer.  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  He underwent radiation from August 31-May 25, 2009.  He was initially diagnosed with sarcomatoid carcinoma of the left mandible, maxilla, and infratemporal fossa status post surgery in April of 2009, then radiotherapy in September of 2009 followed by re-irradiation in October 2010 for recurrence.   INTERVAL HISTORY:  Chase Berger is here today with his wife and daughter.  He is doing well today.  He is here for f/u of his h/o recurrent sarcomatoid carcinoma involving left mandible, maxilla, and infratemporal fossa.  He is in good spirits today.  He denies any pain, swelling, drainage, or new oral lesions.  He denies any dysphagia, odynophagia, blurred vision, double vision, or headaches.  Is hard of hearing (which is chronic for him).  He has lost 3 pounds intentionally, and his wife says that he is, "cutting back" on his intake, and that his appetite is good.  His thyroid was last checked in May, 2017 and was normal.  He denies any new pain, questions or concerns.    ROS: A complete 14 point review of systems was negative.   SOCIAL HISTORY: Lives at home with his wife.  Occasional glass of wine, no tobacco, or illicit drug use.    CURRENT MEDICATIONS: Current Outpatient Prescriptions  Medication Sig Dispense Refill  . Artificial Tear Ointment (ARTIFICIAL TEARS) ointment as needed. Both eyes,  Uses gtts as well.    . cetirizine (ZYRTEC) 10 MG tablet Take 10 mg by mouth as needed for allergies.    Marland Kitchen KEPPRA 500 MG tablet TAKE 1 TABLET BY MOUTH EVERY MORNING AND 2 TABLETS AT BEDTIME 270 tablet 4  . magnesium hydroxide (MILK OF MAGNESIA) 400 MG/5ML suspension Take by mouth daily as needed. Capful as needed constipation     . pravastatin (PRAVACHOL) 20 MG tablet take 1 tablet by mouth once daily 30 tablet 11  . rOPINIRole (REQUIP) 1 MG tablet Take 1 tablet (1 mg total) by mouth 3 (three) times daily. 270 tablet 4  . dipyridamole-aspirin (AGGRENOX) 200-25 MG 12hr capsule Take 1 capsule by mouth 2 (two) times daily. 180 capsule 3   No current facility-administered medications for this visit.    ALLERGIES: No Known Allergies  PHYSICAL EXAM: BP 129/62 (BP Location: Left Arm, Patient Position: Sitting)   Pulse (!) 49   Temp 97.9 F (36.6 C) (Oral)   Resp 18   Ht 6' (1.829 m)   Wt 196 lb 6.4 oz (89.1 kg)   SpO2 100%   BMI 26.64 kg/m  GENERAL: Patient is a chronically ill appearing older male in no acute distress HEENT:  PERRL.  Sclerae anicteric.  Oropharynx clear and moist. No oral ulcerations lesions, or evidence of recurrence.  Mild left eye puffiness with minimal tearing.  Cerumen impacted in left ear canal, right TM with minimal amount of fluid noted.  Frontal and maxillary sinuses are nontender to palpation. Does have left surgical mandible defect. No thyromegaly present.   NODES:  No cervical, supraclavicular, infraclavicular, or axillary lymphadenopathy palpated.  LUNGS:  Clear to auscultation bilaterally.  No wheezes or rhonchi. HEART:  Regular rate and rhythm. No murmur appreciated. ABDOMEN:  Soft, nontender.  Positive, normoactive bowel sounds. No organomegaly palpated. EXTREMITIES:  Scant bilateral lower extremity edema.  SKIN:  Clear with no obvious rashes or skin changes. No nail dyscrasia. NEURO:  Nonfocal. Well oriented.  Appropriate affect.  LABORATORY DATA:    ASSESSMENT AND PLAN:  Chase Berger is a pleasant 83 year old male with h/o recurrent sarcomatoid cancer involving the left mandible.  He is now 7+ years out from radiation.  1. Recurrent sarcomatoid cancer involving the left mandible: Patient has no sign of recurrence.  He is doing well post treatment.  He normally sees Dr.  [name] in February every year.  Due to this, I will see him this August, repeat a TSH at that time, and then see him annually thereafter.  He will see Dr. Tammi Klippel if needed.    2.  At risk for hypothyroidism:  Chase Berger understands that he is at risk for hypothyroidism due to h/o radiation therapy.  His last TSH was normal in May.  I reviewed with the patient and his family members that we will check his thyroid again at his next appointment.  I also reviewed signs and symptoms of hypothyroidism with the patient and his family in detail.  Patient and family verbalized understanding.  Dispo:  -Follow up with Dr. [name] in February as scheduled -Return to cancer center in August.    A total of 30 minutes was spent in face to face care of this patient, with greater than 50% of that time in counseling and care coordination.     Charlestine Massed, NP [place] [phone]

## 2016-09-25 ENCOUNTER — Other Ambulatory Visit: Payer: Self-pay | Admitting: Internal Medicine

## 2017-03-14 ENCOUNTER — Telehealth: Payer: Self-pay | Admitting: *Deleted

## 2017-03-14 NOTE — Telephone Encounter (Signed)
TC from pt's wife to cancel appts (lab and Thedore Mins, NP) for Monday, 03/18/17. Wife wants to re-schedule for September 10th

## 2017-03-18 ENCOUNTER — Other Ambulatory Visit: Payer: Medicare Other

## 2017-03-18 ENCOUNTER — Ambulatory Visit: Payer: Medicare Other | Admitting: Adult Health

## 2017-04-19 ENCOUNTER — Other Ambulatory Visit: Payer: Self-pay

## 2017-04-22 ENCOUNTER — Other Ambulatory Visit: Payer: Medicare Other

## 2017-04-22 ENCOUNTER — Ambulatory Visit: Payer: Medicare Other | Admitting: Adult Health

## 2017-05-02 ENCOUNTER — Ambulatory Visit
Admission: RE | Admit: 2017-05-02 | Discharge: 2017-05-02 | Disposition: A | Discharge status: Home / Self Care | Payer: Medicare Other | Source: Ambulatory Visit | Attending: Radiation Oncology | Admitting: Radiation Oncology

## 2017-05-02 ENCOUNTER — Telehealth: Payer: Self-pay | Admitting: Urology

## 2017-05-02 ENCOUNTER — Other Ambulatory Visit: Payer: Self-pay | Admitting: Radiation Oncology

## 2017-05-02 ENCOUNTER — Encounter: Payer: Self-pay | Admitting: Radiation Oncology

## 2017-05-02 ENCOUNTER — Telehealth: Payer: Self-pay | Admitting: Radiation Oncology

## 2017-05-02 VITALS — BP 140/65 | HR 55 | Temp 97.7°F | Resp 20 | Wt 195.0 lb

## 2017-05-02 DIAGNOSIS — C411 Malignant neoplasm of mandible: Secondary | ICD-10-CM

## 2017-05-02 DIAGNOSIS — Z0189 Encounter for other specified special examinations: Secondary | ICD-10-CM | POA: Diagnosis present

## 2017-05-02 DIAGNOSIS — Z923 Personal history of irradiation: Secondary | ICD-10-CM

## 2017-05-02 LAB — TSH: TSH: 0.785 m[IU]/L (ref 0.320–4.118)

## 2017-05-02 NOTE — Progress Notes (Signed)
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 He denies difficulty swallowing, changes in his auditory acuity, pain, facial swelling, shortness of breath, decreased appetitie, or weight loss. His TSH was drawn today and remains WNL at 0.785. Patient presents with his wife and daughter for routine follow up.  Meds: Current Outpatient Prescriptions  Medication Sig Dispense Refill  . Artificial Tear Ointment (ARTIFICIAL TEARS) ointment as needed. Both eyes,  Uses gtts as well.    . cetirizine (ZYRTEC) 10 MG tablet Take 10 mg by mouth as needed for allergies.    Marland Kitchen dipyridamole-aspirin (AGGRENOX) 200-25 MG 12hr capsule Take 1 capsule by mouth 2 (two) times daily. 180 capsule 3  . KEPPRA 500 MG tablet TAKE 1 TABLET BY MOUTH EVERY MORNING AND 2 TABLETS AT BEDTIME 270 tablet 4  . magnesium hydroxide (MILK OF MAGNESIA) 400 MG/5ML suspension Take by mouth daily as needed. Capful as needed constipation    . pravastatin (PRAVACHOL) 20 MG tablet take 1 tablet by mouth once daily 30 tablet 11  . rOPINIRole (REQUIP) 1 MG tablet Take 1 tablet (1 mg total) by mouth 3 (three) times daily. 270 tablet 4   No current facility-administered medications for this encounter.     Physical Findings:  weight is 195 lb (88.5 kg). His oral temperature is 97.7 F (36.5 C). His blood pressure is 140/65 and his pulse is 55 (abnormal). His respiration  is 20 and oxygen saturation is 100%.    In general, this is a well appearing African American male in no acute distress. He is alert and oriented x4 and appropriate throughout the examination.  Head is [place] and AT. Unchanged surgical mandible defect. He has no trismus. Within the oral cavity, there are no worrisome masses, mucosal lesions, ulcerations, or evidence of thrush. He does have some papillary hypertrophy on the tongue surface. Patient is edentulous. The flap appears to be well-seated. Neck exam is benign without any lymphadenopathy or palpable masses. No significant changes.  Lab Findings: Lab Results  Component Value Date   WBC 5.1 11/23/2013   HGB 13.8 11/23/2013   HCT 41.2 11/23/2013   MCV 94.5 11/23/2013   PLT 256 11/23/2013    Impression:  The patient has no evidence of recurrence.   Plan:  The patient appears to be NED at this time. He will remain under close follow-up with Dr. [name] and will follow-up in radiation oncology annually. TSH today today remains WNL at 0.785. We will plan to repeat a TSH prior to his next scheduled follow up visit.  _____________________________________   Nicholos Johns, PA-C    Tyler Pita, MD  [place]: [phone]  Fax: [phone] [email or web address]  Skype  [email or web address]    Page Me   This document serves as a record of services personally performed by Freeman Caldron, PA-C and Tyler Pita, MD. It was created on their  behalf by Bethann Humble, a trained medical scribe. The creation of this record is based on the scribe's personal observations and the provider's statements to them. This document has been checked and approved by the attending provider.

## 2017-05-02 NOTE — Telephone Encounter (Signed)
-----   Message from Doreen Beam, RN sent at 05/02/2017  3:44 PM EDT -----   ----- Message ----- From: Tyler Pita, MD Sent: 05/02/2017   3:40 PM To: Freeman Caldron, PA-C, Onc Radiation Nurse  Please call patient with normal result.  Thanks. MM

## 2017-05-02 NOTE — Progress Notes (Signed)
Please call patient with normal result.  Thanks. MM

## 2017-05-02 NOTE — Progress Notes (Signed)
Patient denies any issues with swallowing. Denies any issues with sob. Patient states that his appetite is fine. Denies any weight weight loss. Patient looks like he may have some thrush on his tongue. Vitals:   05/02/17 1037  BP: 140/65  Pulse: (!) 55  Resp: 20  Temp: 97.7 F (36.5 C)  TempSrc: Oral  SpO2: 100%  Weight: 195 lb (88.5 kg)   Wt Readings from Last 3 Encounters:  05/02/17 195 lb (88.5 kg)  09/03/16 196 lb 6.4 oz (89.1 kg)  08/28/16 194 lb 8 oz (88.2 kg)

## 2017-05-02 NOTE — Telephone Encounter (Signed)
Phoned patient's home. Spoke with his wife, Lanae Boast. Explained her husband's TSH from today was normal. She verbalized understanding and expressed appreciation for the return call.

## 2017-05-02 NOTE — Telephone Encounter (Signed)
Called and spoke with Ms. Verge to inform of normal TSH results for Mr. Reppond.  Advised that we will plan to repeat this test in 1 year, at the time of his annual f/u visit.  She states her understanding and agreement and will share this information with Jymir.  I advised her to call with any questions or concerns in the interim.  She was quite grateful for the call. Nicholos Johns, PA-C

## 2017-08-26 ENCOUNTER — Ambulatory Visit: Payer: Medicare Other | Admitting: Nurse Practitioner

## 2017-09-23 ENCOUNTER — Other Ambulatory Visit: Payer: Self-pay | Admitting: *Deleted

## 2017-09-23 MED ORDER — ASPIRIN-DIPYRIDAMOLE ER 25-200 MG PO CP12: 1.0000 | ORAL_CAPSULE | Freq: Two times a day (BID) | ORAL | 0 refills | Status: DC

## 2017-10-11 ENCOUNTER — Ambulatory Visit: Payer: Medicare Other | Admitting: Nurse Practitioner

## 2017-11-14 NOTE — Progress Notes (Signed)
[facility]  PATIENT: Chase Berger DOB: [birth year]   REASON FOR VISIT: Follow-up for seizure disorder, Parkinson's disease, history of stroke HISTORY FROM: Patient and wife and daughter   HISTORY OF PRESENT ILLNESS:Chase.75, 84 year old male returns for followup. He has a history of remote stroke, Parkinson's disease, and seizure disorder.He is currently on Keppra Brand. He has had break through seizures on generic in the past. He has had one seizure since last seen he was out of town at [employer] and did not take his medication on time. He is on Aggrenox twice a day for stroke prevention, and Requip for his minor Parkinson's symptoms. 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  He is doing well, independent in daily living. Remains on soft foods and weight is stable. Some difficulty with swallowing at times. No falls . He has not had  symptoms of stroke or TIA since last seen . His Parkinson's symptoms are stable No new neurological complaints.  UPDATE Jan 16th 2018:[name] always keep handkerchife, due to drooling,  I reviewed [facility] record, he had a history of left mandibular, maxillary, infratentorial fossa sarcomatoid carcinoma status post surgery in April of 2009, then radiation therapy in September of 2009, followed by re-radiation therapy in October 2010, there was no recurrent tumor on following up visit. I was able to review MRI of the brain record in July 04, 2001, evidence of intracranial atherosclerotic disease, evidence of chronic right temporal, left occipital lobe stroke, His memory is fine.  He walks reguarly, next to do word puzzle, word search, he watches TV, he sleeps fine, has good appetite  UPDATE 4/8/2018CM Chase Berger, 84 year old male  returns for follow-up with history of stroke Parkinson's disease and seizure disorder.  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  He has not had further stroke or TIA symptoms.  His Parkinson's is stable he denies any falls or balance issues.  Last seizure was several years ago when he missed a couple of doses of medication while at the beach.  He denies any memory issues.  He remains independent in activities of daily living he returns for reevaluation                   REVIEW OF SYSTEMS: Full 14 system review of systems performed and notable only for those listed, all others are neg:  Constitutional: neg  Cardiovascular: neg Ear/Nose/Throat: neg  Skin: neg Eyes: neg Respiratory: neg Gastroitestinal: neg  Hematology/Lymphatic: neg  Endocrine: neg Musculoskeletal:neg Allergy/Immunology: neg Neurological: History of seizure disorder and Parkinson's disease, history of stroke Psychiatric: neg Sleep : neg   ALLERGIES: No Known Allergies  HOME MEDICATIONS: Outpatient Medications Prior to Visit  Medication Sig Dispense Refill  . Artificial Tear Ointment (ARTIFICIAL TEARS) ointment as needed. Both eyes,  Uses gtts as well.    . cetirizine (ZYRTEC) 10 MG tablet Take 10 mg by mouth as needed for allergies.    Marland Kitchen dipyridamole-aspirin (AGGRENOX) 200-25 MG 12hr capsule Take 1 capsule by mouth 2 (two) times daily. 180 capsule 0  . KEPPRA 500 MG tablet TAKE  1 TABLET BY MOUTH EVERY MORNING AND 2 TABLETS AT BEDTIME 270 tablet 4  . magnesium hydroxide (MILK OF MAGNESIA) 400 MG/5ML suspension Take by mouth daily as needed. Capful as needed constipation    . pravastatin (PRAVACHOL) 20 MG tablet take 1 tablet by mouth once daily 30 tablet 11  . rOPINIRole (REQUIP) 1 MG tablet Take 1 tablet  (1 mg total) by mouth 3 (three) times daily. (Patient taking differently: Take by mouth 3 (three) times daily. Taking once tablet in AM and PM and 1/2tablet at [facility].) 270 tablet 4   No facility-administered medications prior to visit.     PAST MEDICAL HISTORY: Past Medical History:  Diagnosis Date  . Allergic rhinitis 09/16/2008  . CVA (cerebral infarction) 06/30/2007  . H/O: iron deficiency anemia 08/30/2008  . Hx of radiation therapy 04/12/09 -05/25/09   left infratemporal fossa  . Hyperlipidemia 09/01/2008  . Malignant neoplasm of intestinal tract, part unspecified ([place]) 09/17/2006   left mandibular sarcomatoid cancer treated in [place] in 2009 with radiation through 2010; last seen 06/2010 with no signs of deformity or progression  . Malignant neoplasm of mandible ([place]) 07/07/2008   left  . Parkinson disease ([place]) 06/30/2007  . Peripheral vascular disease ([place]) 06/24/2006  . Seizure disorder ([place]) 06/24/2006  . Smoker 06/24/2006  . Stroke ([place])   . 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 Urinary frequency 08/30/2008    PAST SURGICAL HISTORY: Past Surgical History:  Procedure Laterality Date  . cancer removed from jaw      FAMILY HISTORY: Family History  Problem Relation Age of Onset  . Heart disease Mother   . Heart attack Mother   . Heart disease Father   . Heart attack Father   . Colon cancer Neg Hx     SOCIAL HISTORY: Social History   Socioeconomic History  . Marital status: Married    Spouse name: [name]  . Number of children: 6  . Years of education: HS  . Highest education level: Not on file  Occupational History  . Occupation: Retired    Fish farm manager: RETIRED  Social Needs  . Financial resource strain: Not on file  . Food insecurity:    Worry: Not on file    Inability: Not on file  . Transportation needs:    Medical: Not on file    Non-medical: Not on file  Tobacco Use  . Smoking status: Former Smoker    Years: 50.00    Types: Cigarettes    Last  attempt to quit: 11/12/2007    Years since quitting: 10.0  . Smokeless tobacco: Former Systems developer    Quit date: 10/01/1999  Substance and Sexual Activity  . Alcohol use: No    Alcohol/week: 0.0 oz  . Drug use: No  . Sexual activity: Not on file  Lifestyle  . Physical activity:    Days per week: Not on file    Minutes per session: Not on file  . Stress: Not on file  Relationships  . Social connections:    Talks on phone: Not on file    Gets together: Not on file    Attends religious service: Not on file    Active member of club or organization: Not on file    Attends meetings of clubs or organizations: Not on file    Relationship status: Not on file  . Intimate partner violence:    Fear of current or ex partner: Not on file    Emotionally abused: Not on file  Physically abused: Not on file    Forced sexual activity: Not on file  Other Topics Concern  . Not on file  Social History Narrative   Patient lives at home with spouse. ( [name] ).   Retired.   Education high school.   Right handed.   Caffeine two cups daily of coffee.     PHYSICAL EXAM  Vitals:   11/18/17 1229  BP: 113/66  Pulse: (!) 54  Weight: 203 lb 3.2 oz (92.2 kg)  Height: 6' (1.829 m)   Body mass index is 27.56 kg/m. Generalized: Well developed, in no acute distress  Head: Distorted left mandibular and cheek region due to surgical resection, oropharynx benign Neck: Supple, no carotid bruits  Cardiac: Regular rate rhythm, no murmur  Musculoskeletal: No deformity   Neurological examination   Mentation: Alert oriented to time, place, history taking. Follows all commands speech and language fluent  Cranial nerve II-XII: Pupils were equal round reactive to light extraocular movements were full, visual field were full on confrontational test. 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 normal. hearing was intact to  finger rubbing bilaterally. Uvula tongue midline. head turning and shoulder shrug were normal and symmetric.Tongue protrusion into cheek strength was normal. Motor: normal bulk and tone, full strength in the BUE, BLE, fine finger movements normal, no pronator drift. No focal weakness, no cogwheeling,  resting tremor mild on the right . Sensory: normal and symmetric to light touch, pinprick, and vibration  Coordination: finger-nose-finger, heel-to-shin bilaterally, no dysmetria Reflexes: Brachioradialis 2/2, biceps 2/2, triceps 2/2, patellar 2/2, Achilles 2/2, plantar responses were flexor bilaterally. Gait and Station: Rising up from seated position without assistance, normal stance, moderate stride, good arm swing, smooth turning, able to perform tiptoe, and heel walking without difficulty. No assistive device   DIAGNOSTIC DATA (LABS, IMAGING, TESTING) -  Lab Results  Component Value Date   TSH 0.785 05/02/2017      ASSESSMENT AND PLAN  84 y.o. 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  His symptoms are currently stable   PLAN: Continue Aggrenox for secondary stroke prevention, will refill Continue Keppra for seizure prevention, will refill Call for any seizure activity Continue Requip for parkinsons disease three times daily will refill Be careful with ambulation due to risk for falls Follow-up yearly Dennie Bible, [facility], [facility], APRN  [facility] [address], [place], [place] [address] [phone]

## 2017-11-18 ENCOUNTER — Encounter: Payer: Self-pay | Admitting: Nurse Practitioner

## 2017-11-18 ENCOUNTER — Ambulatory Visit: Payer: Medicare Other | Admitting: Nurse Practitioner

## 2017-11-18 VITALS — BP 113/66 | HR 54 | Ht 72.0 in | Wt 203.2 lb

## 2017-11-18 DIAGNOSIS — G20 Parkinson's disease: Secondary | ICD-10-CM | POA: Diagnosis not present

## 2017-11-18 DIAGNOSIS — R569 Unspecified convulsions: Secondary | ICD-10-CM

## 2017-11-18 DIAGNOSIS — I6350 Cerebral infarction due to unspecified occlusion or stenosis of unspecified cerebral artery: Secondary | ICD-10-CM

## 2017-11-18 MED ORDER — ASPIRIN-DIPYRIDAMOLE ER 25-200 MG PO CP12: 1.0000 | ORAL_CAPSULE | Freq: Two times a day (BID) | ORAL | 3 refills | Status: DC

## 2017-11-18 MED ORDER — ROPINIROLE HCL 1 MG PO TABS: 1.0000 mg | ORAL_TABLET | Freq: Three times a day (TID) | ORAL | 3 refills | Status: DC

## 2017-11-18 MED ORDER — KEPPRA 500 MG PO TABS: ORAL_TABLET | ORAL | 3 refills | Status: DC

## 2017-11-18 NOTE — Patient Instructions (Signed)
Continue Aggrenox for secondary stroke prevention, will refill Continue Keppra for seizure prevention, will refill Call for any seizure activity Continue Requip for parkinsons disease three times daily will refill Be careful with ambulation due to risk for falls

## 2017-11-20 NOTE — Progress Notes (Signed)
I have reviewed and agreed above plan. 

## 2018-04-15 ENCOUNTER — Telehealth: Payer: Self-pay | Admitting: Adult Health

## 2018-04-15 NOTE — Telephone Encounter (Signed)
Faxed medical records to [facility]: [employer], Release ID: [id number]

## 2018-04-16 ENCOUNTER — Telehealth: Payer: Self-pay | Admitting: *Deleted

## 2018-04-16 NOTE — Telephone Encounter (Signed)
CALLED PATIENT TO ALTER LAB AND FU ON 04-22-18 DUE TO ASHLYN B. BEING IN THE [facility], MOVED APPTS. TO 04-23-18, LVM FOR A RETURN CALL

## 2018-04-18 ENCOUNTER — Telehealth: Payer: Self-pay | Admitting: *Deleted

## 2018-04-18 NOTE — Telephone Encounter (Signed)
Returned patient's phone call , no answer.

## 2018-04-22 ENCOUNTER — Ambulatory Visit: Payer: Self-pay | Admitting: Urology

## 2018-04-22 ENCOUNTER — Ambulatory Visit: Payer: Medicare Other

## 2018-04-23 ENCOUNTER — Ambulatory Visit: Payer: Medicare Other | Attending: Radiation Oncology

## 2018-04-23 ENCOUNTER — Ambulatory Visit: Payer: Medicare Other | Admitting: Urology

## 2018-04-23 DIAGNOSIS — C411 Malignant neoplasm of mandible: Secondary | ICD-10-CM | POA: Insufficient documentation

## 2018-04-23 DIAGNOSIS — Z0189 Encounter for other specified special examinations: Secondary | ICD-10-CM | POA: Insufficient documentation

## 2018-04-29 ENCOUNTER — Ambulatory Visit: Payer: Medicare Other

## 2018-04-29 ENCOUNTER — Ambulatory Visit
Admission: RE | Admit: 2018-04-29 | Discharge: 2018-04-29 | Disposition: A | Discharge status: Home / Self Care | Payer: Medicare Other | Source: Ambulatory Visit | Attending: Urology | Admitting: Urology

## 2018-05-16 NOTE — Progress Notes (Addendum)
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   Pain:No Swallowing issues:Denies having any problem eating or drinking uses a straw to drink liquids. Still eating soft foods. Fatigue:No able to take a walk during the day. Appetite:Good. Saw Dr. [name] at [facility] in January 2019 Weight: Wt Readings from Last 3 Encounters:  05/20/18 201 lb 9.6 oz (91.4 kg)  11/18/17 203 lb 3.2 oz (92.2 kg)  05/02/17 195 lb (88.5 kg)  BP (!) 144/69 (BP Location: Right Arm, Patient Position: Sitting)   Pulse (!) 48   Temp 97.7 F (36.5 C) (Axillary) Comment (Src): Left  Resp 20   Ht 6' (1.829 m)   Wt 201 lb 9.6 oz (91.4 kg)   SpO2 100%   BMI 27.34 kg/m

## 2018-05-20 ENCOUNTER — Encounter: Payer: Self-pay | Admitting: Urology

## 2018-05-20 ENCOUNTER — Other Ambulatory Visit: Payer: Self-pay

## 2018-05-20 ENCOUNTER — Ambulatory Visit
Admission: RE | Admit: 2018-05-20 | Discharge: 2018-05-20 | Disposition: A | Discharge status: Home / Self Care | Payer: Medicare Other | Source: Ambulatory Visit | Attending: Urology | Admitting: Urology

## 2018-05-20 ENCOUNTER — Other Ambulatory Visit: Payer: Self-pay | Admitting: Urology

## 2018-05-20 VITALS — BP 144/69 | HR 48 | Temp 97.7°F | Resp 20 | Ht 72.0 in | Wt 201.6 lb

## 2018-05-20 DIAGNOSIS — Z0189 Encounter for other specified special examinations: Secondary | ICD-10-CM | POA: Insufficient documentation

## 2018-05-20 DIAGNOSIS — C411 Malignant neoplasm of mandible: Secondary | ICD-10-CM

## 2018-05-20 DIAGNOSIS — Z8589 Personal history of malignant neoplasm of other organs and systems: Secondary | ICD-10-CM | POA: Insufficient documentation

## 2018-05-20 DIAGNOSIS — Z923 Personal history of irradiation: Secondary | ICD-10-CM | POA: Insufficient documentation

## 2018-05-20 DIAGNOSIS — Z08 Encounter for follow-up examination after completed treatment for malignant neoplasm: Secondary | ICD-10-CM | POA: Insufficient documentation

## 2018-05-20 NOTE — Progress Notes (Signed)
  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 He denies difficulty swallowing, changes in his auditory acuity, pain, facial swelling, shortness of breath, decreased appetitie, or weight loss. His most recent TSH was performed 05/02/18 and was WNL at 0.785. Patient presents with his wife and daughter for routine follow up.  Meds: Current Outpatient Medications  Medication Sig Dispense Refill  . Artificial Tear Ointment (ARTIFICIAL TEARS) ointment as needed. Both eyes,  Uses gtts as well.    . cetirizine (ZYRTEC) 10 MG tablet Take 10 mg by mouth as needed for allergies.    Marland Kitchen dipyridamole-aspirin (AGGRENOX) 200-25 MG 12hr capsule Take 1 capsule by mouth 2 (two) times daily. 180 capsule 3  . KEPPRA 500 MG tablet TAKE 1 TABLET BY MOUTH EVERY MORNING AND 2 TABLETS AT BEDTIME 270 tablet 3  . magnesium hydroxide (MILK OF MAGNESIA) 400 MG/5ML suspension Take by mouth daily as needed. Capful as needed constipation    . pravastatin (PRAVACHOL) 20 MG tablet take 1 tablet by mouth once daily 30 tablet 11  . rOPINIRole (REQUIP) 1 MG tablet Take 1 tablet (1 mg total) by mouth 3 (three) times daily. 270 tablet 3   No current facility-administered medications for this encounter.     Physical Findings:  vitals were not taken for this visit.    In general, this is a well appearing African American male in no acute distress. He is alert  and oriented x4 and appropriate throughout the examination.  Head is [place] and AT. Unchanged surgical mandible defect. He has no trismus. Within the oral cavity, there are no worrisome masses, mucosal lesions, ulcerations, or evidence of thrush. He does have some papillary hypertrophy on the tongue surface. Patient is edentulous. The flap appears to be well-seated. Neck exam is benign without any lymphadenopathy or palpable masses. No significant changes.  Lab Findings: Lab Results  Component Value Date   WBC 5.1 11/23/2013   HGB 13.8 11/23/2013   HCT 41.2 11/23/2013   MCV 94.5 11/23/2013   PLT 256 11/23/2013    Impression:  The patient has no evidence of recurrence.   Plan:  The patient appears to be NED at this time. He will remain under close follow-up with Dr. [name] whom he is scheduled to see in 08/2018 and will follow-up in radiation oncology annually. TSH performed today is WNL at 0.963.  We will plan to repeat a TSH prior to his next scheduled follow up visit in 1 year.  They know to call at any time with questions or concerns in the interim.    Chase Berger, MMS, PA-C [place] [facility] [place]: [phone]  Fax: [phone]

## 2018-05-21 LAB — TSH: TSH: 0.963 u[IU]/mL (ref 0.320–4.118)

## 2018-05-22 ENCOUNTER — Other Ambulatory Visit: Payer: Self-pay | Admitting: Urology

## 2018-05-22 DIAGNOSIS — C411 Malignant neoplasm of mandible: Secondary | ICD-10-CM

## 2018-05-23 ENCOUNTER — Telehealth: Payer: Self-pay | Admitting: *Deleted

## 2018-05-23 NOTE — Telephone Encounter (Signed)
CALLED PATIENT TO INFORM OF 1 YR. FU AND LAB, NO ANSWER, MAILED APPT. CARD

## 2018-11-05 ENCOUNTER — Other Ambulatory Visit: Payer: Self-pay | Admitting: Nurse Practitioner

## 2018-11-10 ENCOUNTER — Other Ambulatory Visit: Payer: Self-pay | Admitting: Nurse Practitioner

## 2018-11-14 ENCOUNTER — Other Ambulatory Visit: Payer: Self-pay | Admitting: Nurse Practitioner

## 2018-11-24 ENCOUNTER — Ambulatory Visit: Payer: Medicare Other | Admitting: Neurology

## 2018-11-24 ENCOUNTER — Ambulatory Visit: Payer: Medicare Other | Admitting: Nurse Practitioner

## 2019-02-07 ENCOUNTER — Other Ambulatory Visit: Payer: Self-pay | Admitting: Neurology

## 2019-02-13 ENCOUNTER — Other Ambulatory Visit: Payer: Self-pay | Admitting: Neurology

## 2019-02-16 ENCOUNTER — Other Ambulatory Visit: Payer: Self-pay | Admitting: *Deleted

## 2019-02-16 MED ORDER — ASPIRIN-DIPYRIDAMOLE ER 25-200 MG PO CP12: 1.0000 | ORAL_CAPSULE | Freq: Two times a day (BID) | ORAL | 0 refills | Status: DC

## 2019-02-16 NOTE — Telephone Encounter (Signed)
Pts daughter called to see if this refill can be sent in sooner rather than later.

## 2019-05-14 ENCOUNTER — Other Ambulatory Visit: Payer: Self-pay | Admitting: Neurology

## 2019-05-20 NOTE — Progress Notes (Signed)
PATIENT: Chase Berger DOB: [birth year]  REASON FOR VISIT: follow up HISTORY FROM: patient  HISTORY OF PRESENT ILLNESS: Today 05/21/19  HISTORY  HISTORY OF PRESENT ILLNESS:Chase Berger, 85 year old male returns for followup. He has a history of remote stroke, Parkinson's disease, and seizure disorder.He is currently on Keppra Brand. He has had break through seizures on generic in the past. He has had one seizure since last seen he was out of town at [employer] and did not take his medication on time. He is on Aggrenox twice a day for stroke prevention, and Requip for his minor Parkinson's symptoms. 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  He is doing well, independent in daily living. Remains on soft foods and weight is stable. Some difficulty with swallowing at times. No falls . He has not had  symptoms of stroke or TIA since last seen . His Parkinson's symptoms are stable No new neurological complaints.  UPDATE Jan 16th 2018:[name] always keep handkerchife,due to drooling,  I reviewed [facility] record, he had a history of left mandibular, maxillary, infratentorial fossa sarcomatoidcarcinoma status post surgery in April of 2009, then radiation therapy in September of 2009, followed by re-radiation therapy in October 2010, there was no recurrent tumor on following up visit. I was able to review MRI of the brain record in November 22,2002, evidence of intracranial atherosclerotic disease, evidence of chronic right temporal, left occipital lobe stroke, His memory is fine. He walks reguarly, next to [place], word search, he watches TV, he sleeps fine,has good appetite  UPDATE 4/8/2019CM Chase Berger, 85 year old male returns for follow-up with history of stroke Parkinson's  disease and seizure disorder.  In addition he has history of left mandibular maxillary infratentorial fossa sarcomatoidcarcinoma status post surgery in April of 2009, then radiation therapy in September of 2009, followed by re-radiation therapy in October 2010, he has not had recurrence.  He has not had further stroke or TIA symptoms.  His Parkinson's is stable he denies any falls or balance issues.  Last seizure was several years ago when he missed a couple of doses of medication while at the beach.  He denies any memory issues.  He remains independent in activities of daily living he returns for reevaluation  Update May 21, 2019 SS: Chase. Berger is an 85 year old male with history of stroke, Parkinson's disease, and seizure disorder.  He remains on brand-name Keppra.  He remains on Aggrenox for stroke prevention, Requip for Parkinson's disease.  He is here today with his wife for follow-up.  He indicates yesterday he had a fall while walking back from mailbox.  He is not sure what caused the fall, he may have tripped, he did not hit his head, does not think he had a seizure.  A neighbor witnessed the fall, came over and helped him get up.  He was not able to get up on his own, because he did not have anything to hold onto to pull up.  He was then able to walk into the house, go about his day, eat dinner.  His wife indicates he was acting normal, she does not think it was a seizure.  He has been compliant with his medications.  He did scrape his knee with the fall.  Overall, he has  been doing well, walks daily in the neighborhood.  He is active around the home and able to perform his ADLs. They feel he has been stable, he goes have a resting tremor to his right hand. His overall health has been well.   REVIEW OF SYSTEMS: Out of a complete 14 system review of symptoms, the patient complains only of the following symptoms, and all other reviewed systems are negative.  Seizures, tremor  ALLERGIES: No  Known Allergies  HOME MEDICATIONS: Outpatient Medications Prior to Visit  Medication Sig Dispense Refill  . Artificial Tear Ointment (ARTIFICIAL TEARS) ointment as needed. Both eyes,  Uses gtts as well.    . cetirizine (ZYRTEC) 10 MG tablet Take 10 mg by mouth as needed for allergies.    Marland Kitchen KEPPRA 500 MG tablet TAKE 1 TABLET BY MOUTH EVERY MORNING, AND 2 TABLETS AT BEDTIME 270 tablet 3  . magnesium hydroxide (MILK OF MAGNESIA) 400 MG/5ML suspension Take by mouth daily as needed. Capful as needed constipation    . pravastatin (PRAVACHOL) 20 MG tablet take 1 tablet by mouth once daily 30 tablet 11  . dipyridamole-aspirin (AGGRENOX) 200-25 MG 12hr capsule Take 1 capsule by mouth 2 (two) times daily. Please call [phone] to schedule yearly follow up appt. 180 capsule 0  . rOPINIRole (REQUIP) 1 MG tablet TAKE 1 TABLET(1 MG) BY MOUTH THREE TIMES DAILY 270 tablet 0  . pravastatin (PRAVACHOL) 20 MG tablet      No facility-administered medications prior to visit.     PAST MEDICAL HISTORY: Past Medical History:  Diagnosis Date  . Allergic rhinitis 09/16/2008  . CVA (cerebral infarction) 06/30/2007  . H/O: iron deficiency anemia 08/30/2008  . Hx of radiation therapy 04/12/09 -05/25/09   left infratemporal fossa  . Hyperlipidemia 09/01/2008  . Malignant neoplasm of intestinal tract, part unspecified ([place]) 09/17/2006   left mandibular sarcomatoid cancer treated in [place] in 2009 with radiation through 2010; last seen 06/2010 with no signs of deformity or progression  . Malignant neoplasm of mandible ([place]) 07/07/2008   left  . Parkinson disease ([place]) 06/30/2007  . Peripheral vascular disease ([place]) 06/24/2006  . Seizure disorder ([place]) 06/24/2006  . Smoker 06/24/2006  . Stroke ([place])   . 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 Urinary frequency 08/30/2008    PAST SURGICAL HISTORY: Past Surgical History:  Procedure Laterality Date  . cancer removed from jaw      FAMILY HISTORY: Family  History  Problem Relation Age of Onset  . Heart disease Mother   . Heart attack Mother   . Heart disease Father   . Heart attack Father   . Colon cancer Neg Hx     SOCIAL HISTORY: Social History   Socioeconomic History  . Marital status: Married    Spouse name: [name]  . Number of children: 6  . Years of education: HS  . Highest education level: Not on file  Occupational History  . Occupation: Retired    Fish farm manager: RETIRED  Social Needs  . Financial resource strain: Not on file  . Food insecurity    Worry: Not on file    Inability: Not on file  . Transportation needs    Medical: Not on file    Non-medical: Not on file  Tobacco Use  . Smoking status: Former Smoker    Years: 50.00    Types: Cigarettes    Quit date: 11/12/2007    Years since quitting: 11.5  . Smokeless tobacco: Former Systems developer  Quit date: 10/01/1999  Substance and Sexual Activity  . Alcohol use: No    Alcohol/week: 0.0 standard drinks  . Drug use: No  . Sexual activity: Not on file  Lifestyle  . Physical activity    Days per week: Not on file    Minutes per session: Not on file  . Stress: Not on file  Relationships  . Social Herbalist on phone: Not on file    Gets together: Not on file    Attends religious service: Not on file    Active member of club or organization: Not on file    Attends meetings of clubs or organizations: Not on file    Relationship status: Not on file  . Intimate partner violence    Fear of current or ex partner: Not on file    Emotionally abused: Not on file    Physically abused: Not on file    Forced sexual activity: Not on file  Other Topics Concern  . Not on file  Social History Narrative   Patient lives at home with spouse. ( [name] ).   Retired.   Education high school.   Right handed.   Caffeine two cups daily of coffee.    PHYSICAL EXAM  Vitals:   05/21/19 0819  BP: 122/82  Pulse: 82  Temp: 97.6 F (36.4 C)  Weight: 208 lb (94.3 kg)  Height: 6'  (1.829 m)   Body mass index is 28.21 kg/m.  Generalized: Well developed, in no acute distress   Neurological examination  Mentation: Alert oriented to time, place, history taking, most of history is provided by his wife. Follows all commands speech and language fluent Cranial nerve II-XII: Pupils were equal round reactive to light. Extraocular movements were full, visual field were full on confrontational test. Facial sensation and strength were normal.  Head turning and shoulder shrug  were normal and symmetric. Motor: The motor testing reveals 5 over 5 strength of all 4 extremities. Good symmetric motor tone is noted throughout.  Mild resting tremor to the right hand noted.  No bradykinesia noted Sensory: Sensory testing is intact to soft touch on all 4 extremities. No evidence of extinction is noted.  Coordination: Cerebellar testing reveals good finger-nose-finger and heel-to-shin bilaterally.  Gait and station: Able to rise from seated position without pushoff, mild limping gait on the right from knee abrasion, good turns, good arm swing, no assistive device Reflexes: Deep tendon reflexes are symmetric and normal bilaterally.   DIAGNOSTIC DATA (LABS, IMAGING, TESTING) - I reviewed patient records, labs, notes, testing and imaging myself where available.  Lab Results  Component Value Date   WBC 5.1 11/23/2013   HGB 13.8 11/23/2013   HCT 41.2 11/23/2013   MCV 94.5 11/23/2013   PLT 256 11/23/2013      Component Value Date/Time   NA 141 11/23/2013 1623   K 4.5 11/23/2013 1623   CL 102 11/23/2013 1623   CO2 30 11/23/2013 1623   GLUCOSE 75 11/23/2013 1623   BUN 8 11/23/2013 1623   CREATININE 0.91 11/23/2013 1623   CALCIUM 9.9 11/23/2013 1623   PROT 6.8 06/17/2012 1152   ALBUMIN 4.3 06/17/2012 1152   AST 16 06/17/2012 1152   ALT 14 06/17/2012 1152   ALKPHOS 42 06/17/2012 1152   BILITOT 0.5 06/17/2012 1152   GFRNONAA 80 11/23/2013 1623   GFRAA >89 11/23/2013 1623   Lab  Results  Component Value Date   CHOL 133 11/23/2013  HDL 42 11/23/2013   LDLCALC 78 11/23/2013   TRIG 67 11/23/2013   CHOLHDL 3.2 11/23/2013   No results found for: HGBA1C No results found for: VITAMINB12 Lab Results  Component Value Date   TSH 0.963 05/20/2018    ASSESSMENT AND PLAN 85 y.o. year old male  has a past medical history of Allergic rhinitis (09/16/2008), CVA (cerebral infarction) (06/30/2007), H/O: iron deficiency anemia (08/30/2008), radiation therapy (04/12/09 -05/25/09), Hyperlipidemia (09/01/2008), Malignant neoplasm of intestinal tract, part unspecified ([place]) (09/17/2006), Malignant neoplasm of mandible ([place]) (07/07/2008), Parkinson disease ([place]) (06/30/2007), Peripheral vascular disease ([place]) (06/24/2006), Seizure disorder ([place]) (06/24/2006), Smoker (06/24/2006), Stroke ([place]), Tubulovillous adenoma of colon (09/17/2006), and Urinary frequency (08/30/2008). here with:  1.  Parkinson's disease -Continue Requip 1 mg tablet 3 times a day, has been helpful for tremor -Continue daily walking  2.  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  Recent fall -Single fall event since last seen, yesterday on the way back from the mailbox, had a fall for unknown reason, witnessed by a neighbor, not felt to the seizure, he was helped up, able to go about the rest of his day, was not postictal, has been compliant with Keppra, abrasion to right knee -Be very careful with ambulation, let us know if falls continue or if weakness becomes an issue  4.  History of stroke -Continue Aggrenox 1 capsule twice daily  Follow-up in 6 months or sooner if needed.  I did advise if symptoms worsen or if he develops any new symptoms he should let us know.   I spent 25  minutes with the patient. 50% of this time was spent discussing his plan of care.   Butler Denmark, AGNP-C, DNP 05/21/2019, 8:40 AM [facility] [address] [place], [place] [address] [phone]

## 2019-05-21 ENCOUNTER — Ambulatory Visit: Payer: Medicare Other

## 2019-05-21 ENCOUNTER — Other Ambulatory Visit: Payer: Self-pay

## 2019-05-21 ENCOUNTER — Encounter: Payer: Self-pay | Admitting: Neurology

## 2019-05-21 ENCOUNTER — Ambulatory Visit: Payer: Medicare Other | Admitting: Neurology

## 2019-05-21 ENCOUNTER — Ambulatory Visit: Payer: Self-pay | Admitting: Radiation Oncology

## 2019-05-21 VITALS — BP 122/82 | HR 82 | Temp 97.6°F | Ht 72.0 in | Wt 208.0 lb

## 2019-05-21 DIAGNOSIS — R569 Unspecified convulsions: Secondary | ICD-10-CM | POA: Diagnosis not present

## 2019-05-21 DIAGNOSIS — G20 Parkinson's disease: Secondary | ICD-10-CM | POA: Diagnosis not present

## 2019-05-21 DIAGNOSIS — G20A1 Parkinson's disease without dyskinesia, without mention of fluctuations: Secondary | ICD-10-CM

## 2019-05-21 MED ORDER — ROPINIROLE HCL 1 MG PO TABS: ORAL_TABLET | ORAL | 1 refills | Status: DC

## 2019-05-21 MED ORDER — ASPIRIN-DIPYRIDAMOLE ER 25-200 MG PO CP12: 1.0000 | ORAL_CAPSULE | Freq: Two times a day (BID) | ORAL | 1 refills | Status: DC

## 2019-05-21 NOTE — Patient Instructions (Signed)
1. Continue current medications  2. Please let us know if he continue falling  3. Return in 6 months

## 2019-05-22 ENCOUNTER — Ambulatory Visit: Payer: Medicare Other

## 2019-05-22 ENCOUNTER — Ambulatory Visit: Payer: Medicare Other | Admitting: Radiation Oncology

## 2019-05-22 ENCOUNTER — Telehealth: Payer: Self-pay | Admitting: *Deleted

## 2019-05-22 NOTE — Telephone Encounter (Signed)
Called patient to inform of lab and fu visit with Dr. Tammi Klippel on 06-02-19 - lab - 3 pm and fu with Dr. Tammi Klippel @ 3:30 pm, spoke with patient and he is aware of this appt.

## 2019-05-25 NOTE — Progress Notes (Signed)
I have reviewed and agreed above plan. 

## 2019-05-26 ENCOUNTER — Ambulatory Visit: Payer: Medicare Other

## 2019-05-26 ENCOUNTER — Ambulatory Visit: Payer: Self-pay | Admitting: Radiation Oncology

## 2019-06-02 ENCOUNTER — Ambulatory Visit
Admission: RE | Admit: 2019-06-02 | Discharge: 2019-06-02 | Disposition: A | Discharge status: Home / Self Care | Payer: Medicare Other | Source: Ambulatory Visit | Attending: Urology | Admitting: Urology

## 2019-06-02 ENCOUNTER — Ambulatory Visit
Admission: RE | Admit: 2019-06-02 | Discharge: 2019-06-02 | Disposition: A | Discharge status: Home / Self Care | Payer: Medicare Other | Source: Ambulatory Visit | Attending: Radiation Oncology | Admitting: Radiation Oncology

## 2019-06-02 ENCOUNTER — Other Ambulatory Visit: Payer: Self-pay

## 2019-06-02 ENCOUNTER — Encounter: Payer: Self-pay | Admitting: Radiation Oncology

## 2019-06-02 VITALS — BP 110/62 | HR 55 | Temp 98.7°F | Resp 16 | Wt 205.0 lb

## 2019-06-02 DIAGNOSIS — Z8589 Personal history of malignant neoplasm of other organs and systems: Secondary | ICD-10-CM | POA: Insufficient documentation

## 2019-06-02 DIAGNOSIS — C411 Malignant neoplasm of mandible: Secondary | ICD-10-CM

## 2019-06-02 DIAGNOSIS — Z923 Personal history of irradiation: Secondary | ICD-10-CM | POA: Diagnosis not present

## 2019-06-02 DIAGNOSIS — Z79899 Other long term (current) drug therapy: Secondary | ICD-10-CM | POA: Insufficient documentation

## 2019-06-02 NOTE — Progress Notes (Signed)
Received patient and wife in clinic for yearly follow up appointment with Dr. Tammi Klippel. TSH drawn 05/02/2018 proved to be 0.785. TSH drawn today prior to this appointment is in progress. Weight and vitals stable. Denies pain. Flap well without appearance of redness, edema or infection. No facial swelling noted. No lumps or bumps felt in the neck region. Denies pain or difficulty associated with swallowing. Reports a normal appetite. Denies changes in his hearing. Patient endorses following up with Dr. [name] in March [address] prior to start of [place]. Patient plans to see Dr. [name] again the first of the coming year. Denies seizure activity. Tremor in right arm noted related to Parkinsons.   BP 110/62 (BP Location: Right Arm)   Pulse (!) 55   Temp 98.7 F (37.1 C) (Oral)   Resp 16   Wt 205 lb (93 kg)   SpO2 98%   BMI 27.80 kg/m  Wt Readings from Last 3 Encounters:  06/02/19 205 lb (93 kg)  05/21/19 208 lb (94.3 kg)  05/20/18 201 lb 9.6 oz (91.4 kg)

## 2019-06-02 NOTE — Progress Notes (Signed)
  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 He denies difficulty swallowing, changes in his auditory acuity, pain, facial swelling, shortness of breath, decreased appetitie, or weight loss. His TSH was drawn today and remains pending. Patient presents with his daughter for routine follow up.  Meds: Current Outpatient Medications  Medication Sig Dispense Refill  . Artificial Tear Ointment (ARTIFICIAL TEARS) ointment as needed. Both eyes,  Uses gtts as well.    . cetirizine (ZYRTEC) 10 MG tablet Take 10 mg by mouth as needed for allergies.    Marland Kitchen dipyridamole-aspirin (AGGRENOX) 200-25 MG 12hr capsule Take 1 capsule by mouth 2 (two) times daily. Please call [phone] to schedule yearly follow up appt. 180 capsule 1  . KEPPRA 500 MG tablet TAKE 1 TABLET BY MOUTH EVERY MORNING, AND 2 TABLETS AT BEDTIME 270 tablet 3  . pravastatin (PRAVACHOL) 20 MG tablet take 1 tablet by mouth once daily 30 tablet 11  . rOPINIRole (REQUIP) 1 MG tablet TAKE 1 TABLET(1 MG) BY MOUTH THREE TIMES DAILY 270 tablet 1  . magnesium hydroxide (MILK OF MAGNESIA) 400 MG/5ML suspension Take by mouth daily as needed. Capful as needed constipation     No current facility-administered medications for this encounter.     Physical Findings:  weight is 205 lb (93 kg). His oral temperature is 98.7 F (37.1 C). His blood pressure is 110/62 and his pulse is 55  (abnormal). His respiration is 16 and oxygen saturation is 98%.    In general, this is a well appearing African American male in no acute distress. He is alert and oriented x4 and appropriate throughout the examination.  Head is [place] and AT. Unchanged surgical mandible defect. He has no trismus. Within the oral cavity, there are no worrisome masses, mucosal lesions, ulcerations, or evidence of thrush. He does have some papillary hypertrophy on the tongue surface. Patient is edentulous. The flap appears to be well-seated. Neck exam is benign without any lymphadenopathy or palpable masses. No significant changes.  Lab Findings: Lab Results  Component Value Date   WBC 5.1 11/23/2013   HGB 13.8 11/23/2013   HCT 41.2 11/23/2013   MCV 94.5 11/23/2013   PLT 256 11/23/2013    Impression:  The patient has no evidence of recurrence.   Plan:  The patient appears to be NED at this time. He will remain under close follow-up with Dr. [name] and will follow-up in radiation oncology annually. TSH today today is pending. We will plan to repeat a TSH prior to his next scheduled follow up visit in one year.     Tyler Pita, MD  [facility]  Radiation Oncology Direct Dial: [phone]  Fax: [phone] [email or web address]  Skype  [email or web address]

## 2019-06-03 ENCOUNTER — Telehealth: Payer: Self-pay | Admitting: Radiation Oncology

## 2019-06-03 LAB — TSH: TSH: 1.074 u[IU]/mL (ref 0.320–4.118)

## 2019-06-03 NOTE — Telephone Encounter (Signed)
Phoned patient. No answer. Left voicemail message explaining his TSH was normal. Explained we will plan to repeat his TSH again prior to his next annual visit. Provided my direct phone number if he or his wife has additional questions.

## 2019-07-27 ENCOUNTER — Other Ambulatory Visit: Payer: Self-pay | Admitting: Internal Medicine

## 2019-07-27 ENCOUNTER — Ambulatory Visit
Admission: RE | Admit: 2019-07-27 | Discharge: 2019-07-27 | Disposition: A | Discharge status: Home / Self Care | Payer: Medicare Other | Source: Ambulatory Visit | Attending: Internal Medicine | Admitting: Internal Medicine

## 2019-07-27 DIAGNOSIS — M25559 Pain in unspecified hip: Secondary | ICD-10-CM

## 2019-09-03 ENCOUNTER — Other Ambulatory Visit: Payer: Self-pay | Admitting: *Deleted

## 2019-09-03 MED ORDER — ROPINIROLE HCL 1 MG PO TABS: ORAL_TABLET | ORAL | 1 refills | Status: AC

## 2019-09-03 MED ORDER — KEPPRA 500 MG PO TABS: ORAL_TABLET | ORAL | 3 refills | Status: AC

## 2019-09-03 MED ORDER — ASPIRIN-DIPYRIDAMOLE ER 25-200 MG PO CP12: 1.0000 | ORAL_CAPSULE | Freq: Two times a day (BID) | ORAL | 1 refills | Status: DC

## 2019-09-28 ENCOUNTER — Ambulatory Visit: Payer: Medicare Other | Attending: Internal Medicine

## 2019-09-28 DIAGNOSIS — Z23 Encounter for immunization: Secondary | ICD-10-CM | POA: Insufficient documentation

## 2019-09-28 NOTE — Progress Notes (Signed)
   Covid-19 Vaccination Clinic  Name:  Chase Berger    MRN: [id number] DOB: 14-Apr-1933  09/28/2019  [name] was observed post Covid-19 immunization for 15 minutes without incidence. He was provided with Vaccine Information Sheet and instruction to access the V-Safe system.   [name] was instructed to call 911 with any severe reactions post vaccine: Marland Kitchen Difficulty breathing  . Swelling of your face and throat  . A fast heartbeat  . A bad rash all over your body  . Dizziness and weakness    Immunizations Administered    Name Date Dose VIS Date Route   Pfizer COVID-19 Vaccine 09/28/2019  3:37 PM 0.3 mL 07/24/2019 Intramuscular   Manufacturer: [place]   Lot: [id number]   [place]: [address]-1

## 2019-10-12 ENCOUNTER — Other Ambulatory Visit: Payer: Self-pay

## 2019-10-12 ENCOUNTER — Emergency Department (HOSPITAL_COMMUNITY): Payer: Medicare Other

## 2019-10-12 ENCOUNTER — Other Ambulatory Visit: Payer: Self-pay | Admitting: Internal Medicine

## 2019-10-12 ENCOUNTER — Inpatient Hospital Stay (HOSPITAL_COMMUNITY)
Admission: EM | Admit: 2019-10-12 | Discharge: 2019-10-15 | DRG: 181 | Disposition: A | Discharge status: Hospice | Payer: Medicare Other | Source: Ambulatory Visit | Attending: Internal Medicine | Admitting: Internal Medicine

## 2019-10-12 ENCOUNTER — Ambulatory Visit
Admission: RE | Admit: 2019-10-12 | Discharge: 2019-10-12 | Disposition: A | Discharge status: Home / Self Care | Payer: Medicare Other | Source: Ambulatory Visit | Attending: Internal Medicine | Admitting: Internal Medicine

## 2019-10-12 ENCOUNTER — Encounter (HOSPITAL_COMMUNITY): Payer: Self-pay | Admitting: Emergency Medicine

## 2019-10-12 DIAGNOSIS — E785 Hyperlipidemia, unspecified: Secondary | ICD-10-CM | POA: Diagnosis present

## 2019-10-12 DIAGNOSIS — R569 Unspecified convulsions: Secondary | ICD-10-CM

## 2019-10-12 DIAGNOSIS — T8089XA Other complications following infusion, transfusion and therapeutic injection, initial encounter: Secondary | ICD-10-CM | POA: Diagnosis present

## 2019-10-12 DIAGNOSIS — G912 (Idiopathic) normal pressure hydrocephalus: Secondary | ICD-10-CM

## 2019-10-12 DIAGNOSIS — Z515 Encounter for palliative care: Secondary | ICD-10-CM | POA: Diagnosis not present

## 2019-10-12 DIAGNOSIS — R918 Other nonspecific abnormal finding of lung field: Secondary | ICD-10-CM | POA: Diagnosis not present

## 2019-10-12 DIAGNOSIS — R112 Nausea with vomiting, unspecified: Secondary | ICD-10-CM | POA: Diagnosis not present

## 2019-10-12 DIAGNOSIS — G20 Parkinson's disease: Secondary | ICD-10-CM | POA: Diagnosis present

## 2019-10-12 DIAGNOSIS — J9811 Atelectasis: Secondary | ICD-10-CM | POA: Diagnosis present

## 2019-10-12 DIAGNOSIS — Z79899 Other long term (current) drug therapy: Secondary | ICD-10-CM

## 2019-10-12 DIAGNOSIS — Z8249 Family history of ischemic heart disease and other diseases of the circulatory system: Secondary | ICD-10-CM

## 2019-10-12 DIAGNOSIS — Z86010 Personal history of colonic polyps: Secondary | ICD-10-CM

## 2019-10-12 DIAGNOSIS — Z20822 Contact with and (suspected) exposure to covid-19: Secondary | ICD-10-CM | POA: Diagnosis present

## 2019-10-12 DIAGNOSIS — I10 Essential (primary) hypertension: Secondary | ICD-10-CM | POA: Diagnosis present

## 2019-10-12 DIAGNOSIS — C3411 Malignant neoplasm of upper lobe, right bronchus or lung: Secondary | ICD-10-CM | POA: Diagnosis not present

## 2019-10-12 DIAGNOSIS — Z87891 Personal history of nicotine dependence: Secondary | ICD-10-CM

## 2019-10-12 DIAGNOSIS — Z6827 Body mass index (BMI) 27.0-27.9, adult: Secondary | ICD-10-CM

## 2019-10-12 DIAGNOSIS — R339 Retention of urine, unspecified: Secondary | ICD-10-CM | POA: Diagnosis present

## 2019-10-12 DIAGNOSIS — Z85830 Personal history of malignant neoplasm of bone: Secondary | ICD-10-CM

## 2019-10-12 DIAGNOSIS — G40909 Epilepsy, unspecified, not intractable, without status epilepticus: Secondary | ICD-10-CM | POA: Diagnosis present

## 2019-10-12 DIAGNOSIS — Z923 Personal history of irradiation: Secondary | ICD-10-CM

## 2019-10-12 DIAGNOSIS — F1010 Alcohol abuse, uncomplicated: Secondary | ICD-10-CM | POA: Diagnosis present

## 2019-10-12 DIAGNOSIS — Z7189 Other specified counseling: Secondary | ICD-10-CM

## 2019-10-12 DIAGNOSIS — J910 Malignant pleural effusion: Secondary | ICD-10-CM | POA: Diagnosis present

## 2019-10-12 DIAGNOSIS — R627 Adult failure to thrive: Secondary | ICD-10-CM | POA: Diagnosis present

## 2019-10-12 DIAGNOSIS — I6350 Cerebral infarction due to unspecified occlusion or stenosis of unspecified cerebral artery: Secondary | ICD-10-CM | POA: Diagnosis present

## 2019-10-12 DIAGNOSIS — R0602 Shortness of breath: Secondary | ICD-10-CM

## 2019-10-12 DIAGNOSIS — Z66 Do not resuscitate: Secondary | ICD-10-CM | POA: Diagnosis not present

## 2019-10-12 DIAGNOSIS — C787 Secondary malignant neoplasm of liver and intrahepatic bile duct: Secondary | ICD-10-CM | POA: Diagnosis present

## 2019-10-12 DIAGNOSIS — R64 Cachexia: Secondary | ICD-10-CM | POA: Diagnosis present

## 2019-10-12 DIAGNOSIS — C7951 Secondary malignant neoplasm of bone: Secondary | ICD-10-CM | POA: Diagnosis present

## 2019-10-12 DIAGNOSIS — R1310 Dysphagia, unspecified: Secondary | ICD-10-CM | POA: Diagnosis present

## 2019-10-12 DIAGNOSIS — J90 Pleural effusion, not elsewhere classified: Secondary | ICD-10-CM

## 2019-10-12 DIAGNOSIS — Z8673 Personal history of transient ischemic attack (TIA), and cerebral infarction without residual deficits: Secondary | ICD-10-CM

## 2019-10-12 DIAGNOSIS — C3491 Malignant neoplasm of unspecified part of right bronchus or lung: Secondary | ICD-10-CM

## 2019-10-12 DIAGNOSIS — I739 Peripheral vascular disease, unspecified: Secondary | ICD-10-CM | POA: Diagnosis present

## 2019-10-12 LAB — BASIC METABOLIC PANEL
Anion gap: 11 (ref 5–15)
BUN: 20 mg/dL (ref 8–23)
CO2: 26 mmol/L (ref 22–32)
Calcium: 9.7 mg/dL (ref 8.9–10.3)
Chloride: 103 mmol/L (ref 98–111)
Creatinine, Ser: 1.01 mg/dL (ref 0.61–1.24)
GFR calc Af Amer: >60 mL/min (ref >60)
GFR calc non Af Amer: >60 mL/min (ref >60)
Glucose, Bld: 100 mg/dL — ABNORMAL HIGH (ref 70–99)
Potassium: 3.9 mmol/L (ref 3.5–5.1)
Sodium: 140 mmol/L (ref 135–145)

## 2019-10-12 LAB — CBC
HCT: 42.9 % (ref 39.0–52.0)
Hemoglobin: 13.3 g/dL (ref 13.0–17.0)
MCH: 31.4 pg (ref 26.0–34.0)
MCHC: 31 g/dL (ref 30.0–36.0)
MCV: 101.2 fL — ABNORMAL HIGH (ref 80.0–100.0)
Platelets: 463 10*3/uL — ABNORMAL HIGH (ref 150–400)
RBC: 4.24 MIL/uL (ref 4.22–5.81)
RDW: 13.2 % (ref 11.5–15.5)
WBC: 10.7 10*3/uL — ABNORMAL HIGH (ref 4.0–10.5)
nRBC: 0 % (ref 0.0–0.2)

## 2019-10-12 LAB — TROPONIN I (HIGH SENSITIVITY)
Troponin I (High Sensitivity): 21 ng/L — ABNORMAL HIGH (ref <18)
Troponin I (High Sensitivity): 11 ng/L (ref <18)

## 2019-10-12 MED ORDER — ASPIRIN-DIPYRIDAMOLE ER 25-200 MG PO CP12
1.0000 | ORAL_CAPSULE | Freq: Two times a day (BID) | ORAL | Status: DC
  Filled 2019-10-12: qty 1

## 2019-10-12 MED ORDER — ONDANSETRON HCL 4 MG/2ML IJ SOLN
4.0000 mg | Freq: Once | INTRAMUSCULAR | Status: AC
  Administered 2019-10-12: 4 mg via INTRAVENOUS
  Filled 2019-10-12: qty 2

## 2019-10-12 MED ORDER — SODIUM CHLORIDE 0.9% FLUSH: 3.0000 mL | Freq: Once | INTRAVENOUS | Status: DC

## 2019-10-12 MED ORDER — IOHEXOL 350 MG/ML SOLN
100.0000 mL | Freq: Once | INTRAVENOUS | Status: AC | PRN
  Administered 2019-10-12: 100 mL via INTRAVENOUS

## 2019-10-12 MED ORDER — LEVETIRACETAM 500 MG PO TABS
500.0000 mg | ORAL_TABLET | Freq: Two times a day (BID) | ORAL | Status: DC
  Administered 2019-10-12: 500 mg via ORAL
  Filled 2019-10-12: qty 1

## 2019-10-12 MED ORDER — MORPHINE SULFATE (PF) 4 MG/ML IV SOLN
4.0000 mg | Freq: Once | INTRAVENOUS | Status: AC
  Administered 2019-10-12: 4 mg via INTRAVENOUS
  Filled 2019-10-12: qty 1

## 2019-10-12 MED ORDER — PRAVASTATIN SODIUM 10 MG PO TABS
20.0000 mg | ORAL_TABLET | Freq: Every day | ORAL | Status: DC
  Administered 2019-10-12: 20 mg via ORAL
  Filled 2019-10-12: qty 2

## 2019-10-12 NOTE — ED Notes (Signed)
Additional PIV initiated, 20G to L arm. IV flushes with 10 cc NS without s/s of infiltration. Positive blood return. Secured with tape and tegaderm

## 2019-10-12 NOTE — ED Provider Notes (Signed)
[facility] EMERGENCY DEPARTMENT Provider Note   CSN: [id number] Arrival date & time: 10/12/19  1447     History Chief Complaint  Patient presents with  . Lung Mass    Chase Berger is a 86 y.o. male.  86 yo M with a chief complaint of decreased oral intake.  Going on for about 2 weeks now.  Patient has been able to tolerate liquids but not solids.  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  Was had resection plastic surgery and radiation therapy.  Was seen at his ear nose and throat physician about a week ago without obvious cause of his symptoms.  Symptoms have persisted and they saw their family doctor today who did a chest x-ray in the office that showed a lung mass.  Reportedly has not urinated in 48 hours.  Was sent to the ED for admission.  Patient history is limited due to difficulty with phonation.  The history is provided by the patient.  Illness Severity:  Moderate Onset quality:  Gradual Duration:  2 weeks Timing:  Constant Progression:  Worsening Chronicity:  New Associated symptoms: shortness of breath   Associated symptoms: no abdominal pain, no chest pain, no congestion, no diarrhea, no fever, no headaches, no myalgias, no rash and no vomiting        Past Medical History:  Diagnosis Date  . Allergic rhinitis 09/16/2008  . CVA (cerebral infarction) 06/30/2007  . H/O: iron deficiency anemia 08/30/2008  . Hx of radiation therapy 04/12/09 -05/25/09   left infratemporal fossa  . Hyperlipidemia 09/01/2008  . 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 Malignant neoplasm of mandible ([place]) 07/07/2008   left  . Parkinson disease ([place]) 06/30/2007  . Peripheral vascular disease ([place]) 06/24/2006  . Seizure disorder ([place]) 06/24/2006  . Smoker  06/24/2006  . Stroke ([place])   . 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 Urinary frequency 08/30/2008    Patient Active Problem List   Diagnosis Date Noted  . Seizures ([place]) 08/28/2016  . Alcohol use disorder 07/26/2015  . Healthcare maintenance 07/26/2015  . Hx of radiation therapy   . Ectropion 04/15/2012  . 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 Tubular adenoma 10/24/2011  . ALLERGIC RHINITIS 09/16/2008  . DYSLIPIDEMIA 09/01/2008  . IRON DEFICIENCY ANEMIA, HX OF 08/30/2008  . PARKINSON'S DISEASE 06/30/2007  . Cerebral artery occlusion with cerebral infarction ([place]) 06/30/2007  . PERIPHERAL VASCULAR DISEASE 06/24/2006  . Convulsions ([place]) 06/24/2006    Past Surgical History:  Procedure Laterality Date  . cancer removed from jaw         Family History  Problem Relation Age of Onset  . Heart disease Mother   . Heart attack Mother   . Heart disease Father   . Heart attack Father   . Colon cancer Neg Hx     Social History   Tobacco Use  . Smoking status: Former Smoker    Years: 50.00    Types: Cigarettes    Quit date: 11/12/2007    Years since quitting: 11.9  . Smokeless tobacco: Former Systems developer    Quit date: 10/01/1999  Substance Use Topics  . Alcohol use: No    Alcohol/week: 0.0 standard drinks  . Drug use: No    Home Medications Prior to Admission medications  Medication Sig Start Date End Date Taking? Authorizing Provider  Carboxymethylcellul-Glycerin (REFRESH OPTIVE SENSITIVE OP) Place 1 drop into both eyes in the morning and at bedtime.   Yes [provider]  Carboxymethylcellulose Sodium (REFRESH LIQUIGEL) 1 % GEL Place 1 drop into both eyes in the morning.   Yes [provider]  cetirizine (ZYRTEC) 10 MG tablet Take 10 mg by mouth daily as needed for allergies.    Yes [provider]  ciprofloxacin (CIPRO) 250 MG tablet Take 250 mg by mouth 2 (two) times daily. FOR 5 DAYS 10/07/19 11/10/19 Yes [provider]    dipyridamole-aspirin (AGGRENOX) 200-25 MG 12hr capsule Take 1 capsule by mouth 2 (two) times daily. Please call [phone] to schedule yearly follow up appt. Patient taking differently: Take 1 capsule by mouth 2 (two) times daily.  09/03/19  Yes Suzzanne Cloud, NP  KEPPRA 500 MG tablet 1 tab in am and 2 tabs in pm Patient taking differently: Take 500-1,000 mg by mouth See admin instructions. Take 500 mg by mouth in the morning and 1,000 mg in the evening 09/03/19  Yes Suzzanne Cloud, NP  magnesium hydroxide (MILK OF MAGNESIA) 400 MG/5ML suspension Take 15-30 mLs by mouth daily as needed for mild constipation.    Yes [provider]  pravastatin (PRAVACHOL) 20 MG tablet take 1 tablet by mouth once daily Patient taking differently: Take 20 mg by mouth daily.  10/07/15  Yes Corky Sox, MD  rOPINIRole (REQUIP) 1 MG tablet TAKE 1 TABLET(1 MG) BY MOUTH THREE TIMES DAILY Patient taking differently: Take 1 mg by mouth 3 (three) times daily.  09/03/19  Yes Suzzanne Cloud, NP    Allergies    Patient has no known allergies.  Review of Systems   Review of Systems  Constitutional: Negative for chills and fever.  HENT: Positive for trouble swallowing. Negative for congestion and facial swelling.   Eyes: Negative for discharge and visual disturbance.  Respiratory: Positive for shortness of breath.   Cardiovascular: Negative for chest pain and palpitations.  Gastrointestinal: Negative for abdominal pain, diarrhea and vomiting.  Musculoskeletal: Negative for arthralgias and myalgias.  Skin: Negative for color change and rash.  Neurological: Negative for tremors, syncope and headaches.  Psychiatric/Behavioral: Negative for confusion and dysphoric mood.    Physical Exam Updated Vital Signs BP (!) 145/78   Pulse 97   Temp 97.9 F (36.6 C) (Oral)   Resp (!) 25   Ht 6' (1.829 m)   Wt 90.7 kg   SpO2 94%   BMI 27.12 kg/m   Physical Exam Vitals and nursing note reviewed.  Constitutional:       Appearance: He is well-developed.  HENT:     Head: Normocephalic and atraumatic.     Comments: Facial asymmetry right-sided droop mouth opens preferentially on the right side.  Palsy of the right facial nerves. Eyes:     Pupils: Pupils are equal, round, and reactive to light.  Neck:     Vascular: No JVD.  Cardiovascular:     Rate and Rhythm: Normal rate and regular rhythm.     Heart sounds: No murmur. No friction rub. No gallop.   Pulmonary:     Effort: No respiratory distress.     Breath sounds: No wheezing.  Abdominal:     General: There is no distension.     Tenderness: There is no guarding or rebound.  Musculoskeletal:        General: Normal range of motion.  Cervical back: Normal range of motion and neck supple.  Skin:    Coloration: Skin is not pale.     Findings: No rash.  Neurological:     Mental Status: He is alert and oriented to person, place, and time.     Comments: Patient's right upper extremity is slow to respond to stimulus but then seems to strong as the left.  Psychiatric:        Behavior: Behavior normal.     ED Results / Procedures / Treatments   Labs (all labs ordered are listed, but only abnormal results are displayed) Labs Reviewed  BASIC METABOLIC PANEL - Abnormal; Notable for the following components:      Result Value   Glucose, Bld 100 (*)    All other components within normal limits  CBC - Abnormal; Notable for the following components:   WBC 10.7 (*)    MCV 101.2 (*)    Platelets 463 (*)    All other components within normal limits  TROPONIN I (HIGH SENSITIVITY) - Abnormal; Notable for the following components:   Troponin I (High Sensitivity) 21 (*)    All other components within normal limits  SARS CORONAVIRUS 2 (TAT 6-24 HRS)  URINALYSIS, ROUTINE W REFLEX MICROSCOPIC  TROPONIN I (HIGH SENSITIVITY)    EKG EKG Interpretation  Date/Time:  Monday October 12 2019 14:55:19 EST Ventricular Rate:  95 PR Interval:  164 QRS  Duration: 86 QT Interval:  366 QTC Calculation: 459 R Axis:   -35 Text Interpretation: Normal sinus rhythm Left axis deviation Abnormal ECG No significant change since last tracing Confirmed by Deno Etienne [phone]) on 10/12/2019 6:30:30 PM   Radiology DG Chest 2 View  Result Date: 10/12/2019 CLINICAL DATA:  Shortness of breath. EXAM: CHEST - 2 VIEW COMPARISON:  November 15, 2006. FINDINGS: The heart size and mediastinal contours are within normal limits. Left lung is clear. Large right pleural effusion is noted. Large rounded mass is noted in right upper lobe concerning for malignancy. The visualized skeletal structures are unremarkable. IMPRESSION: Large rounded mass seen in right upper lobe concerning for malignancy. Large right pleural effusion is noted. CT scan of the chest is recommended for further evaluation. These results will be called to the ordering clinician or representative by the Radiologist Assistant, and communication documented in the PACS or zVision Dashboard. Electronically Signed   By: Marijo Conception M.D.   On: 10/12/2019 14:05   CT Head Wo Contrast  Result Date: 10/12/2019 CLINICAL DATA:  86 year old male with shortness of breath. EXAM: CT HEAD WITHOUT CONTRAST TECHNIQUE: Contiguous axial images were obtained from the base of the skull through the vertex without intravenous contrast. COMPARISON:  None. FINDINGS: Brain: There is mild age-related atrophy and chronic microvascular ischemic changes. There is dilatation of the ventricles out of proportion with the sulci which may represent central volume loss versus normal pressure hydrocephalus. Clinical correlation is recommended. There is no acute intracranial hemorrhage. No mass effect or midline shift. Old area of infarct and encephalomalacia in the right temporal lobe versus less likely an arachnoid cyst. Vascular: No hyperdense vessel or unexpected calcification. Skull: Normal. Negative for fracture or focal lesion. Sinuses/Orbits:  Diffuse mucoperiosteal thickening of paranasal sinuses. No air-fluid level. The mastoid air cells are clear. Other: None IMPRESSION: 1. No acute intracranial hemorrhage. 2. 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 Clinical correlation is recommended. Electronically Signed   By: Anner Crete M.D.   On: 10/12/2019 22:25  Procedures Procedures (including critical care time) Procedure note: Ultrasound Guided Peripheral IV Ultrasound guided peripheral 1.88 inch angiocath IV placement performed by me. Indications: Nursing unable to place IV. Details: The antecubital fossa and upper arm were evaluated with a multifrequency linear probe. Patent brachial veins were noted. 1 attempt was made to cannulate a vein under realtime US guidance with successful cannulation of the vein and catheter placement. There is return of non-pulsatile dark red blood. The patient tolerated the procedure well without complications. Images archived electronically.  CPT codes: [phone] and [phone]  Medications Ordered in ED Medications  sodium chloride flush (NS) 0.9 % injection 3 mL (has no administration in time range)  dipyridamole-aspirin (AGGRENOX) 200-25 MG per 12 hr capsule 1 capsule (has no administration in time range)  levETIRAcetam (KEPPRA) tablet 500 mg (500 mg Oral Given 10/12/19 2101)  pravastatin (PRAVACHOL) tablet 20 mg (20 mg Oral Given 10/12/19 2101)  morphine 4 MG/ML injection 4 mg (4 mg Intravenous Given 10/12/19 2029)  ondansetron (ZOFRAN) injection 4 mg (4 mg Intravenous Given 10/12/19 2029)  iohexol (OMNIPAQUE) 350 MG/ML injection 100 mL (100 mLs Intravenous Contrast Given 10/12/19 2151)    ED Course  I have reviewed the triage vital signs and the nursing notes.  Pertinent labs & imaging results that were available during my care of the patient were reviewed by me and considered in my medical decision making (see chart for details).    MDM  Rules/Calculators/A&P                      86 yo M with a chief complaint of difficulty swallowing.  Going on for the past couple weeks.  Was seen by the ear nose and throat doctor without an obvious cause.  Has a significant past medical history of squamous cell carcinoma of the mandible.  Was seen by their family doctor and had an x-ray that showed a lung mass.  Due to failure to thrive and his lung mass he was sent to the ED for admission.  I will obtain a CT scan to try and evaluate the issue with his difficulty swallowing.  1 point family endorsed some decreased bowel movements and describes some feculent emesis.  Will obtain a CT abdomen pelvis to evaluate for bowel obstruction.  CT soft soft tissue of the neck to evaluate for upper pathology.  CT the head for acute intracranial abnormality.  Lab work has returned and appears at his baseline.  No significant renal dysfunction.  He is tolerating secretions on exam.  Signed out to Dr. Randal Buba please see their note for further details of care in the ED.   The patients results and plan were reviewed and discussed.   Any x-rays performed were independently reviewed by myself.   Differential diagnosis were considered with the presenting HPI.  Medications  sodium chloride flush (NS) 0.9 % injection 3 mL (has no administration in time range)  dipyridamole-aspirin (AGGRENOX) 200-25 MG per 12 hr capsule 1 capsule (has no administration in time range)  levETIRAcetam (KEPPRA) tablet 500 mg (500 mg Oral Given 10/12/19 2101)  pravastatin (PRAVACHOL) tablet 20 mg (20 mg Oral Given 10/12/19 2101)  morphine 4 MG/ML injection 4 mg (4 mg Intravenous Given 10/12/19 2029)  ondansetron (ZOFRAN) injection 4 mg (4 mg Intravenous Given 10/12/19 2029)  iohexol (OMNIPAQUE) 350 MG/ML injection 100 mL (100 mLs Intravenous Contrast Given 10/12/19 2151)    Vitals:   10/12/19 1455 10/12/19 1516 10/12/19 1915 10/12/19 2045  BP: 126/64  (!) 137/114 (!) 145/78  Pulse: 94  98 97   Resp: 18  (!) 22 (!) 25  Temp: 97.9 F (36.6 C)     TempSrc: Oral     SpO2: 98%  94% 94%  Weight:  90.7 kg    Height:  6' (1.829 m)      Final diagnoses:  Failure to thrive in adult    Admission/ observation were discussed with the admitting physician, patient and/or family and they are comfortable with the plan.   Final Clinical Impression(s) / ED Diagnoses Final diagnoses:  Failure to thrive in adult    Rx / DC Orders ED Discharge Orders    None       Deno Etienne, DO 10/12/19 2351

## 2019-10-12 NOTE — ED Triage Notes (Signed)
Pt is being sent from Dr. Lina Sar office with c/o shortness of breath. An Xray has been completed showing evidence of a right lobe mass. Daughter is present at bedside [facility]) pt has parkinson's disease.

## 2019-10-12 NOTE — ED Notes (Signed)
PIV initiated, 20G to R hand. IV flushes with 10 cc NS without s/s of infiltration. Secured with tape and tegaderm. Positive blood return noted. Troponin drawn, labeled with 2 pt identifiers, and sent to lab

## 2019-10-12 NOTE — Progress Notes (Signed)
Patient had ~80ccs isovue 350 extravasation to left AC. Dr Golden Circle came to assess pt, IV was removed, arm elevated and verbal instructions given to RN, Lovell Sheehan, to keep arm elevated and apply cold compress. Extravasation orders were placed.

## 2019-10-12 NOTE — Progress Notes (Signed)
Called to see pt in CT for contrast extravasation.  Large palpable lump above left antecubital region consistent with both contrast and saline bolus.   Initial images showed normal flow but then contrast extravasated.   Tech to advise ER on cold compresses to keep swelling down.  Site should be monitored for possible skin breakdown.

## 2019-10-12 NOTE — ED Notes (Signed)
Pt transported to CT ?

## 2019-10-13 ENCOUNTER — Other Ambulatory Visit: Payer: Self-pay

## 2019-10-13 ENCOUNTER — Inpatient Hospital Stay (HOSPITAL_COMMUNITY): Payer: Medicare Other

## 2019-10-13 ENCOUNTER — Encounter (HOSPITAL_COMMUNITY): Payer: Self-pay | Admitting: Emergency Medicine

## 2019-10-13 DIAGNOSIS — Z85830 Personal history of malignant neoplasm of bone: Secondary | ICD-10-CM | POA: Diagnosis not present

## 2019-10-13 DIAGNOSIS — Z66 Do not resuscitate: Secondary | ICD-10-CM | POA: Diagnosis not present

## 2019-10-13 DIAGNOSIS — J9811 Atelectasis: Secondary | ICD-10-CM | POA: Diagnosis present

## 2019-10-13 DIAGNOSIS — C7951 Secondary malignant neoplasm of bone: Secondary | ICD-10-CM | POA: Diagnosis present

## 2019-10-13 DIAGNOSIS — Z7189 Other specified counseling: Secondary | ICD-10-CM | POA: Diagnosis not present

## 2019-10-13 DIAGNOSIS — Z20822 Contact with and (suspected) exposure to covid-19: Secondary | ICD-10-CM | POA: Diagnosis present

## 2019-10-13 DIAGNOSIS — Z6827 Body mass index (BMI) 27.0-27.9, adult: Secondary | ICD-10-CM | POA: Diagnosis not present

## 2019-10-13 DIAGNOSIS — J910 Malignant pleural effusion: Secondary | ICD-10-CM | POA: Diagnosis present

## 2019-10-13 DIAGNOSIS — G20 Parkinson's disease: Secondary | ICD-10-CM | POA: Diagnosis not present

## 2019-10-13 DIAGNOSIS — C787 Secondary malignant neoplasm of liver and intrahepatic bile duct: Secondary | ICD-10-CM | POA: Diagnosis present

## 2019-10-13 DIAGNOSIS — I10 Essential (primary) hypertension: Secondary | ICD-10-CM | POA: Diagnosis present

## 2019-10-13 DIAGNOSIS — Z515 Encounter for palliative care: Secondary | ICD-10-CM | POA: Diagnosis not present

## 2019-10-13 DIAGNOSIS — Z923 Personal history of irradiation: Secondary | ICD-10-CM | POA: Diagnosis not present

## 2019-10-13 DIAGNOSIS — Z79899 Other long term (current) drug therapy: Secondary | ICD-10-CM | POA: Diagnosis not present

## 2019-10-13 DIAGNOSIS — T8089XA Other complications following infusion, transfusion and therapeutic injection, initial encounter: Secondary | ICD-10-CM | POA: Diagnosis present

## 2019-10-13 DIAGNOSIS — Z8249 Family history of ischemic heart disease and other diseases of the circulatory system: Secondary | ICD-10-CM | POA: Diagnosis not present

## 2019-10-13 DIAGNOSIS — R627 Adult failure to thrive: Secondary | ICD-10-CM | POA: Diagnosis not present

## 2019-10-13 DIAGNOSIS — G40909 Epilepsy, unspecified, not intractable, without status epilepticus: Secondary | ICD-10-CM | POA: Diagnosis present

## 2019-10-13 DIAGNOSIS — R64 Cachexia: Secondary | ICD-10-CM | POA: Diagnosis present

## 2019-10-13 DIAGNOSIS — R918 Other nonspecific abnormal finding of lung field: Secondary | ICD-10-CM | POA: Diagnosis present

## 2019-10-13 DIAGNOSIS — C3411 Malignant neoplasm of upper lobe, right bronchus or lung: Secondary | ICD-10-CM | POA: Diagnosis present

## 2019-10-13 DIAGNOSIS — Z87891 Personal history of nicotine dependence: Secondary | ICD-10-CM | POA: Diagnosis not present

## 2019-10-13 DIAGNOSIS — F1010 Alcohol abuse, uncomplicated: Secondary | ICD-10-CM | POA: Diagnosis present

## 2019-10-13 DIAGNOSIS — I739 Peripheral vascular disease, unspecified: Secondary | ICD-10-CM | POA: Diagnosis not present

## 2019-10-13 DIAGNOSIS — Z8673 Personal history of transient ischemic attack (TIA), and cerebral infarction without residual deficits: Secondary | ICD-10-CM | POA: Diagnosis not present

## 2019-10-13 DIAGNOSIS — E785 Hyperlipidemia, unspecified: Secondary | ICD-10-CM | POA: Diagnosis present

## 2019-10-13 HISTORY — PX: IR THORACENTESIS ASP PLEURAL SPACE W/IMG GUIDE: IMG5380

## 2019-10-13 LAB — GRAM STAIN

## 2019-10-13 LAB — CBC
HCT: 44.2 % (ref 39.0–52.0)
Hemoglobin: 14.2 g/dL (ref 13.0–17.0)
MCH: 32.4 pg (ref 26.0–34.0)
MCHC: 32.1 g/dL (ref 30.0–36.0)
MCV: 100.9 fL — ABNORMAL HIGH (ref 80.0–100.0)
Platelets: 452 10*3/uL — ABNORMAL HIGH (ref 150–400)
RBC: 4.38 MIL/uL (ref 4.22–5.81)
RDW: 13.2 % (ref 11.5–15.5)
WBC: 10 10*3/uL (ref 4.0–10.5)
nRBC: 0 % (ref 0.0–0.2)

## 2019-10-13 LAB — PSA: Prostatic Specific Antigen: 1.87 ng/mL (ref 0.00–4.00)

## 2019-10-13 LAB — COMPREHENSIVE METABOLIC PANEL
ALT: 27 U/L (ref 0–44)
AST: 33 U/L (ref 15–41)
Albumin: 2.9 g/dL — ABNORMAL LOW (ref 3.5–5.0)
Alkaline Phosphatase: 59 U/L (ref 38–126)
Anion gap: 13 (ref 5–15)
BUN: 20 mg/dL (ref 8–23)
CO2: 23 mmol/L (ref 22–32)
Calcium: 9.6 mg/dL (ref 8.9–10.3)
Chloride: 103 mmol/L (ref 98–111)
Creatinine, Ser: 0.95 mg/dL (ref 0.61–1.24)
GFR calc Af Amer: >60 mL/min (ref >60)
GFR calc non Af Amer: >60 mL/min (ref >60)
Glucose, Bld: 96 mg/dL (ref 70–99)
Potassium: 4.2 mmol/L (ref 3.5–5.1)
Sodium: 139 mmol/L (ref 135–145)
Total Bilirubin: 0.9 mg/dL (ref 0.3–1.2)
Total Protein: 7 g/dL (ref 6.5–8.1)

## 2019-10-13 LAB — BODY FLUID CELL COUNT WITH DIFFERENTIAL
Eos, Fluid: 3 %
Lymphs, Fluid: 56 %
Monocyte-Macrophage-Serous Fluid: 18 % — ABNORMAL LOW (ref 50–90)
Neutrophil Count, Fluid: 23 % (ref 0–25)
Total Nucleated Cell Count, Fluid: 165 cu mm (ref 0–1000)

## 2019-10-13 LAB — SARS CORONAVIRUS 2 (TAT 6-24 HRS): SARS Coronavirus 2: NEGATIVE

## 2019-10-13 LAB — GLUCOSE, PLEURAL OR PERITONEAL FLUID: Glucose, Fluid: 30 mg/dL

## 2019-10-13 LAB — AMYLASE, PLEURAL OR PERITONEAL FLUID: Amylase, Fluid: 37 U/L

## 2019-10-13 LAB — PROTEIN, PLEURAL OR PERITONEAL FLUID: Total protein, fluid: 4.5 g/dL

## 2019-10-13 MED ORDER — ONDANSETRON HCL 4 MG PO TABS: 4.0000 mg | ORAL_TABLET | Freq: Four times a day (QID) | ORAL | Status: DC | PRN

## 2019-10-13 MED ORDER — LIDOCAINE HCL (PF) 1 % IJ SOLN
INTRAMUSCULAR | Status: DC | PRN
  Administered 2019-10-13: 10 mL

## 2019-10-13 MED ORDER — IOHEXOL 350 MG/ML SOLN
100.0000 mL | Freq: Once | INTRAVENOUS | Status: AC | PRN
  Administered 2019-10-13: 100 mL via INTRAVENOUS

## 2019-10-13 MED ORDER — MAGNESIUM HYDROXIDE 400 MG/5ML PO SUSP: 15.0000 mL | Freq: Every day | ORAL | Status: DC | PRN

## 2019-10-13 MED ORDER — ONDANSETRON HCL 4 MG/2ML IJ SOLN
4.0000 mg | Freq: Four times a day (QID) | INTRAMUSCULAR | Status: DC | PRN
  Administered 2019-10-15: 4 mg via INTRAVENOUS
  Filled 2019-10-13: qty 2

## 2019-10-13 MED ORDER — LEVETIRACETAM 100 MG/ML PO SOLN
500.0000 mg | Freq: Two times a day (BID) | ORAL | Status: DC
  Administered 2019-10-13 – 2019-10-15 (×5): 500 mg via ORAL
  Filled 2019-10-13 (×6): qty 5

## 2019-10-13 MED ORDER — MEGESTROL ACETATE 400 MG/10ML PO SUSP
400.0000 mg | Freq: Two times a day (BID) | ORAL | Status: DC
  Administered 2019-10-13 – 2019-10-15 (×6): 400 mg via ORAL
  Filled 2019-10-13 (×7): qty 10

## 2019-10-13 MED ORDER — HEPARIN SODIUM (PORCINE) 5000 UNIT/ML IJ SOLN
5000.0000 [IU] | Freq: Three times a day (TID) | INTRAMUSCULAR | Status: DC
  Administered 2019-10-13 – 2019-10-14 (×4): 5000 [IU] via SUBCUTANEOUS
  Filled 2019-10-13 (×4): qty 1

## 2019-10-13 MED ORDER — ACETAMINOPHEN 500 MG PO TABS: 500.0000 mg | ORAL_TABLET | ORAL | Status: DC | PRN

## 2019-10-13 MED ORDER — ROPINIROLE HCL 1 MG PO TABS
1.0000 mg | ORAL_TABLET | Freq: Three times a day (TID) | ORAL | Status: DC
  Administered 2019-10-13 – 2019-10-15 (×9): 1 mg via ORAL
  Filled 2019-10-13 (×7): qty 1
  Filled 2019-10-13: qty 2
  Filled 2019-10-13: qty 1

## 2019-10-13 MED ORDER — SODIUM CHLORIDE 0.9 % IV SOLN: INTRAVENOUS | Status: DC

## 2019-10-13 MED ORDER — LORATADINE 10 MG PO TABS
10.0000 mg | ORAL_TABLET | Freq: Every day | ORAL | Status: DC
  Administered 2019-10-13 – 2019-10-15 (×3): 10 mg via ORAL
  Filled 2019-10-13 (×3): qty 1

## 2019-10-13 MED ORDER — HYDROMORPHONE HCL 1 MG/ML IJ SOLN
1.0000 mg | INTRAMUSCULAR | Status: DC | PRN
  Administered 2019-10-13: 1 mg via INTRAVENOUS
  Filled 2019-10-13: qty 1

## 2019-10-13 MED ORDER — LIDOCAINE HCL 1 % IJ SOLN
INTRAMUSCULAR | Status: AC
  Filled 2019-10-13: qty 20

## 2019-10-13 MED ORDER — POLYVINYL ALCOHOL 1.4 % OP SOLN
1.0000 [drp] | Freq: Every morning | OPHTHALMIC | Status: DC
  Administered 2019-10-13 – 2019-10-15 (×3): 1 [drp] via OPHTHALMIC
  Filled 2019-10-13: qty 15

## 2019-10-13 NOTE — Social Work (Signed)
CSW stopped by room to see pt. Introduced self, role, reason for visit. Pt with speech difficulty, unable to assess fully at this time. CSW aware that PMT is consulted for clarifying further GOC. Will follow for that discussion to further support with disposition.  Westley Hummer, MSW, [place] Work

## 2019-10-13 NOTE — Procedures (Signed)
Ultrasound-guided diagnostic and therapeutic right sided thoracentesis performed yielding 3.2 liters of serosanguineous  colored fluid. No immediate complications.   Diagnostic fluid was sent to the lab for further analysis. Follow-up chest x-ray pending. EBL is none.

## 2019-10-13 NOTE — Plan of Care (Signed)

## 2019-10-13 NOTE — Progress Notes (Signed)
Palliative-   Consult received and chart reviewed. I called patient's daughter- introduced Palliative Medicine and offered to schedule GOC meeting at bedside. She is discussing with her mother and will call back.  Thank you for this consult.  Mariana Kaufman, AGNP-C Palliative Medicine  Please call Palliative Medicine team phone with any questions [phone]. For individual providers please see AMION.  No charge  .

## 2019-10-13 NOTE — Progress Notes (Signed)
0220 Pt arrived in unit from ED,alert orientedx4 with vital signs as follow: Temp: 97.7, BP: 117/71 (86), HR: 95, RR:20,Spo2: 100 on 2L [place]. Patient wheelchair bound, oriented to room and bed controls.   0410 No input for intake and output found, bladder scanned patient= 314, MD notified. Performed in and out with Ander Purpura, RN per MDs order. Output of 800 ml taken out, patient tolerated well. Will continue to monitor patient with remainder of shift.

## 2019-10-13 NOTE — Progress Notes (Signed)
Patient seen and examined and agree with plan of care as per my partner who saw the patient this morning Dr. 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 [name] oncology as Dr. Rosendo Gros had as follows with Ledon Snare of radiation oncology here in [place]  It looks like he developed some new onset dysphagia a couple of weeks prior to this admission and followed with Dr. Gelene Mink swallowing study was performed on 2/23  He went to see his primary care physician Dr. Delfina Redwood and was found to have a lung mass and a large pleural effusion In the ED he was also found to have urinary retention  White count 10 platelet 463 CT of chest showing 4.5 x 4.2 x 3 point anterior right upper lobe with compressive atelectasis large right pleural effusion and mediastinum with slight leftward shift 3.9 mm lesion liver 90 destructive lesion in thoracic spine pelvis rectal spine  Thoracentesis scheduled per IR on IV fluids 100 cc/h  O/e BP (!) 138/95 (BP Location: Left Arm)   Pulse 88   Temp 97.7 F (36.5 C) (Oral)   Resp 16   Ht 6' (1.829 m)   Wt 90.7 kg   SpO2 98%   BMI 27.12 kg/m  He is somewhat cachectic has postoperative changes to his left jaw he has an area that is open at the TMJ on the inside of his mouth which looks like a sinus S1-S2 no murmur rub or gallop Chest is clinically clear no added sound no rales no rhonchi Abdomen is slightly distended with prior PEG tube scar in the upper epigastrium No lower extremity edema moving all 4 limbs equally-mumbles to  talk but seems to understand and follow commands power is 5/5 bilaterally grossly  I had a 20-minute conversation with his daughter Levada Dy in person and with Lindwood Coke his wife on the phone--they are undecided about further diagnostic work-up and it appears that in the past patient has said he would not want to "suffer" when I asked him about CODE STATUS they asked me "what is your opinion I believe that prior to any further work-up including biopsy of either spine or liver or lung we should get a clear indication as to what the goals of care are and I will consult palliative care to see him Because it is unclear if he has ever had prostatism in the past and with his new onset urinary retention I will get a PSA If he does not void we will ask for the nurse to check bladder scans and if he is above 150 cc or 175 cc he may need an indwelling Foley  Repeat labs a.m. and follow x-ray to denote clearing of lungs post procedure  Verneita Griffes, MD [facility] 10:10 AM

## 2019-10-13 NOTE — ED Notes (Signed)
Cool compress applied to infiltration site

## 2019-10-13 NOTE — ED Notes (Addendum)
Pts daughter would like to leave number and set password [name] [phone], she would like password set as [place] Chard

## 2019-10-13 NOTE — H&P (Signed)
History and Physical   YARETH MACDONNELL YOV:[id number] DOB: [birth year] DOA: 10/12/2019  Referring MD/NP/PA: Dr. Tyrone Nine  PCP: [name], MD   Outpatient Specialists: Dr. Tammi Klippel  Patient coming from: Home  Chief Complaint: Generalized weakness and failure to thrive  HPI: COLUM COLT is a 86 y.o. male with medical history significant of head and neck cancer status post left mandibular surgery in 2009 with subsequent chemo and radiation followed by Dr. Tammi Klippel, history of seizure, hyperlipidemia, Parkinson's disease, remote tobacco and alcohol use, hypertension who was brought in by the daughter due to generalized weakness failure to thrive and exertional dyspnea.  Patient has been weak.  He is communicating but most history is obtained from the daughter who is at bedside.  He has apparently been declining with decreased oral intake over the last 2 weeks.  This is getting worse.  Was seen by ENT but no obvious cause of his dysphagia.  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  Also had his radiation therapy with plastic repair.  Now however he is declining.  In the ER evaluation showed a large right lung mass with large pleural effusion.  Also suspicious metastatic lesions to the bones of the thorax.  At this point patient is being admitted for evaluation and treatment..  ED Course: Temperature 97.9 blood pressure 145/78 pulse 98 respiratory 25 oxygen sat 94% room air.  White count is 10.7 hemoglobin 13.3 platelet 463.  Chemistry entirely within normal.  CT abdomen pelvis, CT lung angiogram, CT neck were done showing a 4.5 x 4.2 x 3.9 cm mass in the anterior right upper lobe with adjacent compressive atelectasis.  There is also a large right pleural effusion extending into the mediastinum with slight leftward shift of mediastinal structures.  There is also a 3.9 mm lesion in the liver and lytic destructive metastatic lesion within the thoracic  spine for pelvis and sacrum as well as cervical spine.  Patient is being admitted therefore with suspicion of long malignancy with metastasis.  Review of Systems: As per HPI otherwise 10 point review of systems negative.    Past Medical History:  Diagnosis Date  . Allergic rhinitis 09/16/2008  . CVA (cerebral infarction) 06/30/2007  . H/O: iron deficiency anemia 08/30/2008  . Hx of radiation therapy 04/12/09 -05/25/09   left infratemporal fossa  . Hyperlipidemia 09/01/2008  . 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 Malignant neoplasm of mandible ([place]) 07/07/2008   left  . Parkinson disease ([place]) 06/30/2007  . Peripheral vascular disease ([place]) 06/24/2006  . Seizure disorder ([place]) 06/24/2006  . Smoker 06/24/2006  . Stroke ([place])   . 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 Urinary frequency 08/30/2008    Past Surgical History:  Procedure Laterality Date  . cancer removed from jaw       reports that he quit smoking about 11 years ago. His smoking use included cigarettes. He quit after 50.00 years of use. He quit smokeless tobacco use about 20 years ago. He reports that he does not drink alcohol or use drugs.  No Known Allergies  Family History  Problem Relation Age of Onset  . Heart disease Mother   . Heart attack Mother   . Heart disease Father   . Heart attack Father   .  Colon cancer Neg Hx      Prior to Admission medications   Medication Sig Start Date End Date Taking? Authorizing Provider  Carboxymethylcellul-Glycerin (REFRESH OPTIVE SENSITIVE OP) Place 1 drop into both eyes in the morning and at bedtime.   Yes [provider]  Carboxymethylcellulose Sodium (REFRESH LIQUIGEL) 1 % GEL Place 1 drop into both eyes in the morning.   Yes [provider]  cetirizine  (ZYRTEC) 10 MG tablet Take 10 mg by mouth daily as needed for allergies.    Yes [provider]  ciprofloxacin (CIPRO) 250 MG tablet Take 250 mg by mouth 2 (two) times daily. FOR 5 DAYS 10/07/19 11/10/19 Yes [provider]  dipyridamole-aspirin (AGGRENOX) 200-25 MG 12hr capsule Take 1 capsule by mouth 2 (two) times daily. Please call [phone] to schedule yearly follow up appt. Patient taking differently: Take 1 capsule by mouth 2 (two) times daily.  09/03/19  Yes Suzzanne Cloud, NP  KEPPRA 500 MG tablet 1 tab in am and 2 tabs in pm Patient taking differently: Take 500-1,000 mg by mouth See admin instructions. Take 500 mg by mouth in the morning and 1,000 mg in the evening 09/03/19  Yes Suzzanne Cloud, NP  magnesium hydroxide (MILK OF MAGNESIA) 400 MG/5ML suspension Take 15-30 mLs by mouth daily as needed for mild constipation.    Yes [provider]  pravastatin (PRAVACHOL) 20 MG tablet take 1 tablet by mouth once daily Patient taking differently: Take 20 mg by mouth daily.  10/07/15  Yes Corky Sox, MD  rOPINIRole (REQUIP) 1 MG tablet TAKE 1 TABLET(1 MG) BY MOUTH THREE TIMES DAILY Patient taking differently: Take 1 mg by mouth 3 (three) times daily.  09/03/19  Yes Suzzanne Cloud, NP    Physical Exam: Vitals:   10/12/19 1516 10/12/19 1915 10/12/19 2045 10/13/19 0022  BP:  (!) 137/114 (!) 145/78 121/89  Pulse:  98 97 95  Resp:  (!) 22 (!) 25 (!) 22  Temp:      TempSrc:      SpO2:  94% 94% 94%  Weight: 90.7 kg     Height: 6' (1.829 m)         Constitutional: Chronically ill looking, cachectic, no acute distress Vitals:   10/12/19 1516 10/12/19 1915 10/12/19 2045 10/13/19 0022  BP:  (!) 137/114 (!) 145/78 121/89  Pulse:  98 97 95  Resp:  (!) 22 (!) 25 (!) 22  Temp:      TempSrc:      SpO2:  94% 94% 94%  Weight: 90.7 kg     Height: 6' (1.829 m)      Eyes: PERRL, lids and conjunctivae normal ENMT: Mucous membranes are dry. Posterior pharynx clear of any  exudate or lesions.Normal dentition.  Neck: normal, supple, no masses, no thyromegaly Respiratory: Decreased air entry bilaterally right more than left, positive egophony, crackles, no expiratory wheezing normal respiratory effort. No accessory muscle use.  Cardiovascular: Sinus tachycardia, no murmurs / rubs / gallops. No extremity edema. 2+ pedal pulses. No carotid bruits.  Abdomen: no tenderness, no masses palpated. No hepatosplenomegaly. Bowel sounds positive.  Musculoskeletal: no clubbing / cyanosis. No joint deformity upper and lower extremities. Good ROM, no contractures. Normal muscle tone.  Skin: no rashes, lesions, ulcers. No induration Neurologic: CN 2-12 grossly intact. Sensation intact, DTR normal. Strength 5/5 in all 4.  Psychiatric: Normal judgment and insight. Alert and oriented x 3. Normal mood.     Labs on Admission:  I have personally reviewed following labs and imaging studies  CBC: Recent Labs  Lab 10/12/19 1524  WBC 10.7*  HGB 13.3  HCT 42.9  MCV 101.2*  PLT 947*   Basic Metabolic Panel: Recent Labs  Lab 10/12/19 1524  NA 140  K 3.9  CL 103  CO2 26  GLUCOSE 100*  BUN 20  CREATININE 1.01  CALCIUM 9.7   GFR: Estimated Creatinine Clearance: 59.8 mL/min (by C-G formula based on SCr of 1.01 mg/dL). Liver Function Tests: No results for input(s): AST, ALT, ALKPHOS, BILITOT, PROT, ALBUMIN in the last 168 hours. No results for input(s): LIPASE, AMYLASE in the last 168 hours. No results for input(s): AMMONIA in the last 168 hours. Coagulation Profile: No results for input(s): INR, PROTIME in the last 168 hours. Cardiac Enzymes: No results for input(s): CKTOTAL, CKMB, CKMBINDEX, TROPONINI in the last 168 hours. BNP (last 3 results) No results for input(s): PROBNP in the last 8760 hours. HbA1C: No results for input(s): HGBA1C in the last 72 hours. CBG: No results for input(s): GLUCAP in the last 168 hours. Lipid Profile: No results for input(s): CHOL,  HDL, LDLCALC, TRIG, CHOLHDL, LDLDIRECT in the last 72 hours. Thyroid Function Tests: No results for input(s): TSH, T4TOTAL, FREET4, T3FREE, THYROIDAB in the last 72 hours. Anemia Panel: No results for input(s): VITAMINB12, FOLATE, FERRITIN, TIBC, IRON, RETICCTPCT in the last 72 hours. Urine analysis:    Component Value Date/Time   COLORURINE YELLOW 08/30/2008 2148   APPEARANCEUR CLEAR 08/30/2008 2148   LABSPEC 1.007 08/30/2008 2148   PHURINE 7.0 08/30/2008 2148   GLUCOSEU NEG mg/dL 08/30/2008 2148   BILIRUBINUR NEG 08/30/2008 2148   KETONESUR NEG mg/dL 08/30/2008 2148   PROTEINUR NEG mg/dL 08/30/2008 2148   UROBILINOGEN 0.2 08/30/2008 2148   NITRITE NEG 08/30/2008 2148   LEUKOCYTESUR SMALL (A) 08/30/2008 2148   Sepsis Labs: @LABRCNTIP (procalcitonin:4,lacticidven:4) )No results found for this or any previous visit (from the past 240 hour(s)).   Radiological Exams on Admission: DG Chest 2 View  Result Date: 10/12/2019 CLINICAL DATA:  Shortness of breath. EXAM: CHEST - 2 VIEW COMPARISON:  November 15, 2006. FINDINGS: The heart size and mediastinal contours are within normal limits. Left lung is clear. Large right pleural effusion is noted. Large rounded mass is noted in right upper lobe concerning for malignancy. The visualized skeletal structures are unremarkable. IMPRESSION: Large rounded mass seen in right upper lobe concerning for malignancy. Large right pleural effusion is noted. CT scan of the chest is recommended for further evaluation. These results will be called to the ordering clinician or representative by the Radiologist Assistant, and communication documented in the PACS or zVision Dashboard. Electronically Signed   By: Marijo Conception M.D.   On: 10/12/2019 14:05   CT Head Wo Contrast  Result Date: 10/12/2019 CLINICAL DATA:  86 year old male with shortness of breath. EXAM: CT HEAD WITHOUT CONTRAST TECHNIQUE: Contiguous axial images were obtained from the base of the skull through  the vertex without intravenous contrast. COMPARISON:  None. FINDINGS: Brain: There is mild age-related atrophy and chronic microvascular ischemic changes. There is dilatation of the ventricles out of proportion with the sulci which may represent central volume loss versus normal pressure hydrocephalus. Clinical correlation is recommended. There is no acute intracranial hemorrhage. No mass effect or midline shift. Old area of infarct and encephalomalacia in the right temporal lobe versus less likely an arachnoid cyst. Vascular: No hyperdense vessel or unexpected calcification. Skull: Normal. Negative for fracture or focal lesion.  Sinuses/Orbits: Diffuse mucoperiosteal thickening of paranasal sinuses. No air-fluid level. The mastoid air cells are clear. Other: None IMPRESSION: 1. No acute intracranial hemorrhage. 2. 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 Clinical correlation is recommended. Electronically Signed   By: Anner Crete M.D.   On: 10/12/2019 22:25   CT Soft Tissue Neck W Contrast  Result Date: 10/13/2019 CLINICAL DATA:  Initial evaluation for acute dysphagia, shortness of breath. 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 EXAM: CT NECK WITH CONTRAST TECHNIQUE: Multidetector CT imaging of the neck was performed using the standard protocol following the bolus administration of intravenous contrast. CONTRAST:  143mL OMNIPAQUE IOHEXOL 350 MG/ML SOLN COMPARISON:  Prior CT of the chest, abdomen, and pelvis from 10/12/2019. FINDINGS: Pharynx and larynx: Oral cavity within normal limits without discrete mass or collection. Postoperative changes from previous left mandibulectomy with fat graft placement. No soft tissue mass to suggest locally recurrent disease. Postsurgical changes noted within the left infratemporal fossa. Remaining oropharynx and nasopharynx within normal limits. No  retropharyngeal collection or swelling. Epiglottis normal. Vallecula clear. Mild pooling of secretions noted within the hypopharynx. Piriform sinuses are clear. Glottis within normal limits. Subglottic trachea deviated to the left and mildly narrowed at the level of the thyroid but otherwise widely patent. Salivary glands: Right parotid and submandibular glands are within normal limits. Left parotid and submandibular glands have been resected. Thyroid: Enlarged heterogeneous multinodular thyroid, suggesting multinodular goiter. Dominant nodule at the posterior right thyroid lobe measures up to 18 mm. Lymph nodes: No pathologically enlarged lymph nodes identified within the neck. Hazy soft tissue stranding seen at left level 2 and involving the left carotid space favored to be postoperative in nature. Vascular: Normal intravascular enhancement seen throughout the neck. Scattered atheromatous change seen about the aortic arch, carotid bifurcations, and carotid siphons. Limited intracranial: Age-related cerebral atrophy. Visualized portions of the brain otherwise grossly unremarkable. Visualized orbits: Prior bilateral ocular lens replacement. Globes and orbital soft tissues otherwise unremarkable. Mastoids and visualized paranasal sinuses: Mild to moderate chronic mucosal thickening noted within the ethmoidal air cells and maxillary sinuses. Small retention cyst noted at the left sphenoid sinus. Visualized paranasal sinuses are otherwise clear. Small chronic appearing left mastoid effusion. Mastoid air cells and middle ear cavities are otherwise well pneumatized and clear. Skeleton: Asymmetric sclerosis seen involving the left middle cranial fossa and skull base, likely reflecting post radiation changes. Lytic destructive lesion involving the T3 vertebral body concerning for metastatic disease. Associated pathologic fracture with up to 30% height loss without significant bony retropulsion (series 15, image 49). 6 mm  lucent lesion within the left aspect of the manubrium concerning for metastatic disease as well (series 18, image 127). No other acute osseous abnormality. Moderate cervical spondylosis present at C4-5 through C6-7. Patient is edentulous. Upper chest: Previously identified right upper lobe mass with associated large right pleural effusion partially visualize, better evaluated on prior CT of the chest. Other: None. IMPRESSION: 1. No acute soft tissue abnormality within the neck. 2. Postoperative changes from previous left mandibulectomy. No evidence for locally recurrent disease. 3. Lytic metastatic lesion involving the T3 vertebral body with associated pathologic fracture and 30% height loss. Probable additional 6 mm lytic metastasis involving the left manubrium. 4. Previously identified right upper lobe mass with associated large right pleural effusion, better evaluated on prior CT of the chest. 5. Enlarged multinodular thyroid, with dominant 18 mm right thyroid nodule. Further  assessment with dedicated thyroid ultrasound recommended if not already performed. This could be performed on a nonemergent outpatient basis. Electronically Signed   By: Jeannine Boga M.D.   On: 10/13/2019 01:07   CT Angio Chest PE W and/or Wo Contrast  Result Date: 10/13/2019 CLINICAL DATA:  Dysplasia, right lobe mass and shortness of breath, history of left mandibular sarcomatoid cancer EXAM: CT chest, ABDOMEN AND PELVIS WITH CONTRAST TECHNIQUE: Multidetector CT imaging of the chest, abdomen and pelvis was performed using the standard protocol following bolus administration of intravenous contrast. CONTRAST:  162mL OMNIPAQUE IOHEXOL 350 MG/ML SOLN COMPARISON:  Radiograph same day FINDINGS: Cardiovascular: There is a optimal opacification of the pulmonary arteries. There is no central,segmental, or subsegmental filling defects within the pulmonary arteries. The heart is normal in size. No pericardial effusion or thickening. No  evidence right heart strain. There is normal three-vessel brachiocephalic anatomy without proximal stenosis. Coronary artery calcifications are seen. There is scattered aortic atherosclerosis noted. Mediastinum/Nodes: There is heterogeneously enlarged bilateral thyroid lobes, right greater the left with punctate calcifications seen. The large amount of pleural fluid extends into the mediastinum and causes leftward deviation of the mediastinal structures. Lungs/Pleura: Focal rounded heterogeneous mass within the anterior right upper lobe which measures 4.5 x 4.2 x 3.9 cm. Surrounding small amount of spiculated opacity is noted. There is compressive atelectasis seen within the right middle lobe and right lower lobe. A large right pleural effusion is noted which extends into the mediastinum and causes leftward deviation. Small amount of left basilar ground-glass atelectasis is noted. Musculoskeletal: There is a lytic lesion seen in the superior left T3 vertebral body causing cortical breakthrough. There does appear to be some extension into the inter disc space at T3-T4. Mild prevertebral soft tissue swelling is noted. There is also a small lytic lesion seen within the inferior T10 vertebral body. Review of the MIP images confirms the above findings. Abdomen/pelvis: Hepatobiliary: There is a slightly nodular liver contour present. Within the posterior right liver lobe there is a 9 mm hypodense lesion which does not appear to be present on prior exam dating back to 2008.the main portal vein is patent. No evidence of calcified gallstones, gallbladder wall thickening or biliary dilatation. Pancreas: Unremarkable. No pancreatic ductal dilatation or surrounding inflammatory changes. Spleen: Normal in size without focal abnormality. Adrenals/Urinary Tract: Both adrenal glands appear normal. The kidneys and collecting system appear normal without evidence of urinary tract calculus or hydronephrosis. Bladder is unremarkable.  Stomach/Bowel: The stomach, small bowel, and colon are normal in appearance. No inflammatory changes, wall thickening, or obstructive findings.The appendix is normal. Vascular/Lymphatic: There are no enlarged mesenteric, retroperitoneal, or pelvic lymph nodes. Scattered aortic atherosclerotic calcifications are seen without aneurysmal dilatation. Reproductive: The prostate is unremarkable. Other: No evidence of abdominal wall mass or hernia. Musculoskeletal: There is a lytic destructive mass seen at the posterior right inferior ilium adjacent to the sacroiliac joint with soft tissue extends into the gluteal musculature. There is also lytic lesions in the superior right iliac wing with periosteal reaction. There is also adjacent lytic lesion involving the inferior right sacrum, bilateral sacral ala and inferior left iliac wing. IMPRESSION: 1. Rounded heterogeneous mass in the anterior right upper lobe measuring 4.5 x 4.2 x 3.9 cm with adjacent compressive atelectasis, consistent with pulmonary neoplasm. 2. Large right probable malignant effusion extending into the mediastinum and causing slight leftward shift of mediastinal structures. 3. 9 mm hypodense lesion in the superior right liver, not seen on prior exam dating  back to 2008, concerning for metastatic disease. 4. Lytic destructive osseous metastatic lesions within the thoracic spine, pelvis, and sacrum as described above. Electronically Signed   By: Prudencio Pair M.D.   On: 10/13/2019 00:16   CT ABDOMEN PELVIS W CONTRAST  Result Date: 10/13/2019 CLINICAL DATA:  Dysplasia, right lobe mass and shortness of breath, history of left mandibular sarcomatoid cancer EXAM: CT chest, ABDOMEN AND PELVIS WITH CONTRAST TECHNIQUE: Multidetector CT imaging of the chest, abdomen and pelvis was performed using the standard protocol following bolus administration of intravenous contrast. CONTRAST:  159mL OMNIPAQUE IOHEXOL 350 MG/ML SOLN COMPARISON:  Radiograph same day  FINDINGS: Cardiovascular: There is a optimal opacification of the pulmonary arteries. There is no central,segmental, or subsegmental filling defects within the pulmonary arteries. The heart is normal in size. No pericardial effusion or thickening. No evidence right heart strain. There is normal three-vessel brachiocephalic anatomy without proximal stenosis. Coronary artery calcifications are seen. There is scattered aortic atherosclerosis noted. Mediastinum/Nodes: There is heterogeneously enlarged bilateral thyroid lobes, right greater the left with punctate calcifications seen. The large amount of pleural fluid extends into the mediastinum and causes leftward deviation of the mediastinal structures. Lungs/Pleura: Focal rounded heterogeneous mass within the anterior right upper lobe which measures 4.5 x 4.2 x 3.9 cm. Surrounding small amount of spiculated opacity is noted. There is compressive atelectasis seen within the right middle lobe and right lower lobe. A large right pleural effusion is noted which extends into the mediastinum and causes leftward deviation. Small amount of left basilar ground-glass atelectasis is noted. Musculoskeletal: There is a lytic lesion seen in the superior left T3 vertebral body causing cortical breakthrough. There does appear to be some extension into the inter disc space at T3-T4. Mild prevertebral soft tissue swelling is noted. There is also a small lytic lesion seen within the inferior T10 vertebral body. Review of the MIP images confirms the above findings. Abdomen/pelvis: Hepatobiliary: There is a slightly nodular liver contour present. Within the posterior right liver lobe there is a 9 mm hypodense lesion which does not appear to be present on prior exam dating back to 2008.the main portal vein is patent. No evidence of calcified gallstones, gallbladder wall thickening or biliary dilatation. Pancreas: Unremarkable. No pancreatic ductal dilatation or surrounding inflammatory  changes. Spleen: Normal in size without focal abnormality. Adrenals/Urinary Tract: Both adrenal glands appear normal. The kidneys and collecting system appear normal without evidence of urinary tract calculus or hydronephrosis. Bladder is unremarkable. Stomach/Bowel: The stomach, small bowel, and colon are normal in appearance. No inflammatory changes, wall thickening, or obstructive findings.The appendix is normal. Vascular/Lymphatic: There are no enlarged mesenteric, retroperitoneal, or pelvic lymph nodes. Scattered aortic atherosclerotic calcifications are seen without aneurysmal dilatation. Reproductive: The prostate is unremarkable. Other: No evidence of abdominal wall mass or hernia. Musculoskeletal: There is a lytic destructive mass seen at the posterior right inferior ilium adjacent to the sacroiliac joint with soft tissue extends into the gluteal musculature. There is also lytic lesions in the superior right iliac wing with periosteal reaction. There is also adjacent lytic lesion involving the inferior right sacrum, bilateral sacral ala and inferior left iliac wing. IMPRESSION: 1. Rounded heterogeneous mass in the anterior right upper lobe measuring 4.5 x 4.2 x 3.9 cm with adjacent compressive atelectasis, consistent with pulmonary neoplasm. 2. Large right probable malignant effusion extending into the mediastinum and causing slight leftward shift of mediastinal structures. 3. 9 mm hypodense lesion in the superior right liver, not seen on prior exam  dating back to 2008, concerning for metastatic disease. 4. Lytic destructive osseous metastatic lesions within the thoracic spine, pelvis, and sacrum as described above. Electronically Signed   By: Prudencio Pair M.D.   On: 10/13/2019 00:16      Assessment/Plan Principal Problem:   Mass of upper lobe of right lung Active Problems:   PARKINSON'S DISEASE   Cerebral artery occlusion with cerebral infarction ([place])   PERIPHERAL VASCULAR DISEASE   Seizures  ([place])     #1 mass in the right lung: Patient will be admitted.  We will initiate work-up for possible lung cancer with metastasis.  With his history of squamous cell carcinoma of the mandible this could also be secondary to cancer malignant in the lung.  Patient has large right-sided pleural effusion which is probably malignant effusion.  We will get thoracentesis with emphasis on cytology to determine the primaries source of the mass.  Discussed case with the daughter.  Oncology to be involved at some point.  Thoracentesis could also be therapeutic as patient is now slightly short of breath.  #2 failure to thrive: Most likely secondary to malignancy.  He has poor appetite.  I will initiate Megace.  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  Followed still by radiation oncology.  Defer to them.  #5 history of CVA: Stable at this point.  #6 peripheral vascular disease: Continue home regimen  #7 seizure disorder: On Keppra.  Continue   DVT prophylaxis: Heparin Code Status: Full code Family Communication: Daughter at bedside Disposition Plan: To be determined Consults called: None but oncology and IR consult in the morning Admission status: Inpatient  Severity of Illness: The appropriate patient status for this patient is INPATIENT. Inpatient status is judged to be reasonable and necessary in order to provide the required intensity of service to ensure the patient's safety. The patient's presenting symptoms, physical exam findings, and initial radiographic and laboratory data in the context of their chronic comorbidities is felt to place them at high risk for further clinical deterioration. Furthermore, it is not anticipated that the patient will be medically stable for discharge from the hospital within 2 midnights of admission. The following factors support the patient status of inpatient.   " The  patient's presenting symptoms include shortness of breath and failure to thrive. " The worrisome physical exam findings include cachexia. " The initial radiographic and laboratory data are worrisome because of CT findings. 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   * I certify that at the point of admission it is my clinical judgment that the patient will require inpatient hospital care spanning beyond 2 midnights from the point of admission due to high intensity of service, high risk for further deterioration and high frequency of surveillance required.Barbette Merino MD [facility] Pager [phone]  If 7PM-7AM, please contact night-coverage [email or web address] Password [email or web address]  10/13/2019, 1:24 AM

## 2019-10-14 ENCOUNTER — Inpatient Hospital Stay (HOSPITAL_COMMUNITY): Payer: Medicare Other

## 2019-10-14 DIAGNOSIS — Z66 Do not resuscitate: Secondary | ICD-10-CM

## 2019-10-14 DIAGNOSIS — Z7189 Other specified counseling: Secondary | ICD-10-CM

## 2019-10-14 DIAGNOSIS — R569 Unspecified convulsions: Secondary | ICD-10-CM

## 2019-10-14 DIAGNOSIS — R627 Adult failure to thrive: Secondary | ICD-10-CM

## 2019-10-14 DIAGNOSIS — C3491 Malignant neoplasm of unspecified part of right bronchus or lung: Secondary | ICD-10-CM

## 2019-10-14 DIAGNOSIS — J90 Pleural effusion, not elsewhere classified: Secondary | ICD-10-CM

## 2019-10-14 DIAGNOSIS — Z515 Encounter for palliative care: Secondary | ICD-10-CM

## 2019-10-14 DIAGNOSIS — G20 Parkinson's disease: Secondary | ICD-10-CM

## 2019-10-14 DIAGNOSIS — I739 Peripheral vascular disease, unspecified: Secondary | ICD-10-CM

## 2019-10-14 LAB — BASIC METABOLIC PANEL
Anion gap: 11 (ref 5–15)
BUN: 17 mg/dL (ref 8–23)
CO2: 24 mmol/L (ref 22–32)
Calcium: 9.2 mg/dL (ref 8.9–10.3)
Chloride: 104 mmol/L (ref 98–111)
Creatinine, Ser: 0.91 mg/dL (ref 0.61–1.24)
GFR calc Af Amer: >60 mL/min (ref >60)
GFR calc non Af Amer: >60 mL/min (ref >60)
Glucose, Bld: 136 mg/dL — ABNORMAL HIGH (ref 70–99)
Potassium: 4 mmol/L (ref 3.5–5.1)
Sodium: 139 mmol/L (ref 135–145)

## 2019-10-14 LAB — CBC WITH DIFFERENTIAL/PLATELET
Abs Immature Granulocytes: 0.07 10*3/uL (ref 0.00–0.07)
Basophils Absolute: 0 10*3/uL (ref 0.0–0.1)
Basophils Relative: 0 %
Eosinophils Absolute: 0.2 10*3/uL (ref 0.0–0.5)
Eosinophils Relative: 2 %
HCT: 40.7 % (ref 39.0–52.0)
Hemoglobin: 13.3 g/dL (ref 13.0–17.0)
Immature Granulocytes: 1 %
Lymphocytes Relative: 8 %
Lymphs Abs: 0.8 10*3/uL (ref 0.7–4.0)
MCH: 32.4 pg (ref 26.0–34.0)
MCHC: 32.7 g/dL (ref 30.0–36.0)
MCV: 99 fL (ref 80.0–100.0)
Monocytes Absolute: 1 10*3/uL (ref 0.1–1.0)
Monocytes Relative: 9 %
Neutro Abs: 8.6 10*3/uL — ABNORMAL HIGH (ref 1.7–7.7)
Neutrophils Relative %: 80 %
Platelets: 419 10*3/uL — ABNORMAL HIGH (ref 150–400)
RBC: 4.11 MIL/uL — ABNORMAL LOW (ref 4.22–5.81)
RDW: 13.2 % (ref 11.5–15.5)
WBC: 10.6 10*3/uL — ABNORMAL HIGH (ref 4.0–10.5)
nRBC: 0 % (ref 0.0–0.2)

## 2019-10-14 LAB — ACID FAST SMEAR (AFB, MYCOBACTERIA): Acid Fast Smear: NEGATIVE

## 2019-10-14 MED ORDER — MORPHINE SULFATE (CONCENTRATE) 10 MG/0.5ML PO SOLN
5.0000 mg | ORAL | Status: DC | PRN
  Administered 2019-10-15: 5 mg via ORAL
  Filled 2019-10-14: qty 0.5

## 2019-10-14 MED ORDER — DEXAMETHASONE 4 MG PO TABS
2.0000 mg | ORAL_TABLET | Freq: Two times a day (BID) | ORAL | Status: DC
  Administered 2019-10-14 – 2019-10-15 (×2): 2 mg via ORAL
  Filled 2019-10-14 (×2): qty 1

## 2019-10-14 MED ORDER — ATROPINE SULFATE 1 % OP SOLN: 4.0000 [drp] | OPHTHALMIC | Status: DC | PRN

## 2019-10-14 MED ORDER — LORAZEPAM 2 MG/ML PO CONC: 1.0000 mg | ORAL | Status: DC | PRN

## 2019-10-14 MED ORDER — ENSURE ENLIVE PO LIQD
237.0000 mL | Freq: Two times a day (BID) | ORAL | Status: DC
  Administered 2019-10-14: 237 mL via ORAL

## 2019-10-14 MED ORDER — MORPHINE SULFATE (CONCENTRATE) 10 MG/0.5ML PO SOLN
5.0000 mg | ORAL | Status: DC | PRN
  Administered 2019-10-15: 5 mg via SUBLINGUAL
  Filled 2019-10-14: qty 0.5

## 2019-10-14 MED ORDER — LORAZEPAM 1 MG PO TABS: 1.0000 mg | ORAL_TABLET | ORAL | Status: DC | PRN

## 2019-10-14 MED ORDER — LORAZEPAM 2 MG/ML IJ SOLN: 1.0000 mg | INTRAMUSCULAR | Status: DC | PRN

## 2019-10-14 NOTE — Progress Notes (Signed)
CXR reviewed this AM. No pneumothorax visualized.  Per RN, Sats 97% O2. Breathing comfortably.   IR available if needed.   Brynda Greathouse, MS RD PA-C

## 2019-10-14 NOTE — TOC Initial Note (Signed)
Transition of Care [facility]) - Initial/Assessment Note    Patient Details  Name: Chase Berger MRN: [id number] Date of Birth: 21-Jul-1933  Transition of Care [facility]) CM/SW Contact:    Alexander Mt, LCSW Phone Number: 10/14/2019, 6:15 PM  Clinical Narrative:                 CSW spoke with pt daughter Levada Dy via telephone. Introduced self, role, reason for call. Pt from home with his spouse, Levada Dy states she is local and her sister who was present during PMT visit is from [place]. Pt daughter states that pt spouse cannot take care of pt at home and they do not feel comfortable taking him home with hospice. CSW explained that unless pt is actively getting rehab and meets skilled criteria that Medicare will not cover placement with hospice at a SNF. Pt daughter does not think that pt has Medicaid but states "he should qualify for Medicaid CAP". CSW explained that CAPs has a waitlist of months-years long for Medicaid recipients from this writers understanding. Pt daughter states that they cannot private pay for a SNF at this time.   CSW explained that I would reach out to [facility] to assess our options however unless pt family can private pay that I do not feel we would be able to place pt in a SNF. Pt daughter again reinforced that they cannot provide care for pt in the home.   Will reach out to [facility] and f/u with family regarding our options for family.   Expected Discharge Plan: Hospice Medical Facility(vs. SNF with hospice) Barriers to Discharge: Continued Medical Work up, Other (comment)(Hospice Services Screen)   Patient Goals and CMS Choice Patient states their goals for this hospitalization and ongoing recovery are:: for him to be comfortable and to have assistance with caregiving CMS Medicare.gov Compare Post Acute Care list provided to:: (n/a) Choice offered to / list presented to : Adult Children  Expected Discharge Plan and Services Expected Discharge Plan: Hospice Medical  Facility(vs. SNF with hospice) In-house Referral: Clinical Social Work Discharge Planning Services: CM Consult Post Acute Care Choice: Hospice, [facility] Living arrangements for the past 2 months: Single Family Home   Prior Living Arrangements/Services Living arrangements for the past 2 months: Single Family Home Lives with:: Spouse Patient language and need for interpreter reviewed:: Yes(no needs) Do you feel safe going back to the place where you live?: No   spouse unable to take care of pt at home  Need for Family Participation in Patient Care: Yes (Comment)(assistance with daily cares) Care giver support system in place?: Yes (comment)(spouse/adult children but they state they cannot provide significant assistance) Current home services: DME Criminal Activity/Legal Involvement Pertinent to Current Situation/Hospitalization: No - Comment as needed  Permission Sought/Granted Permission sought to share information with : Family Supports Permission granted to share information with : No(pt with fluctuating orientation)  Share Information with NAME: [name]  Permission granted to share info w AGENCY: [facility]  Permission granted to share info w Relationship: daughter  Permission granted to share info w Contact Information: [phone]  Emotional Assessment Appearance:: Appears stated age Attitude/Demeanor/Rapport: (telephonic assessment completed with pt daughter) Affect (typically observed): (telephonic assessment with pt daughter) Orientation: : Oriented to Self, Oriented to Place, Fluctuating Orientation (Suspected and/or reported Sundowners), Oriented to Situation, Oriented to  Time Alcohol / Substance Use: Not Applicable Psych Involvement: (n/a at this time)  Admission diagnosis:  NPH (normal pressure hydrocephalus) (HCC) [G91.2] Pleural effusion [J90] Failure  to thrive in adult [R62.7] Primary malignant neoplasm of right lung metastatic to other site  [facility]) [C34.91] Mass of upper lobe of left lung [R91.8] Patient Active Problem List   Diagnosis Date Noted  . Failure to thrive in adult   . Pleural effusion   . Primary malignant neoplasm of right lung metastatic to other site [facility])   . Parkinson's disease ([place])   . Advanced care planning/counseling discussion   . Goals of care, counseling/discussion   . Do not resuscitate   . Terminal care   . Palliative care by specialist   . Mass of upper lobe of right lung 10/13/2019  . Seizures ([place]) 08/28/2016  . Alcohol use disorder 07/26/2015  . Healthcare maintenance 07/26/2015  . Hx of radiation therapy   . Ectropion 04/15/2012  . 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 Tubular adenoma 10/24/2011  . ALLERGIC RHINITIS 09/16/2008  . DYSLIPIDEMIA 09/01/2008  . IRON DEFICIENCY ANEMIA, HX OF 08/30/2008  . PARKINSON'S DISEASE 06/30/2007  . Cerebral artery occlusion with cerebral infarction ([place]) 06/30/2007  . PERIPHERAL VASCULAR DISEASE 06/24/2006  . Convulsions ([place]) 06/24/2006   PCP:  [name], MD Pharmacy:   [facility] [place], [place] [address] [place] [address] Phone: [phone] Fax: [phone]  [facility] [place], [place] - [address] [place] [address] Phone: [phone] Fax: [phone]   Readmission Risk Interventions Readmission Risk Prevention Plan 10/14/2019  Post Dischage Appt Not Complete  Appt Comments plan for comfort care  Medication Screening Complete  Transportation Screening Complete  Some recent data might be hidden

## 2019-10-14 NOTE — Progress Notes (Signed)
Comfort Care

## 2019-10-14 NOTE — Social Work (Signed)
CSW called and spoke briefly with daughter Levada Dy, CSW will call back around 4:30pm to speak with pt daughter regarding disposition.   Westley Hummer, MSW, [place] Work

## 2019-10-14 NOTE — Social Work (Signed)
Spoke with PMT provider Mariana Kaufman, NP. Aware of pt family interest in SNF with hospice. Per facesheet pt does not have Medicaid coverage only a managed payor. SNF placement will be private pay, f/u and discuss with pt daughter at [phone] as soon as I am able.  Westley Hummer, MSW, [place] Work

## 2019-10-14 NOTE — Progress Notes (Signed)
PROGRESS NOTE  Chase Berger PNT:[id number] DOB: July 10, 1933 DOA: 10/12/2019 PCP: [name], MD  HPI/Recap of past 24 hours: HPI from Dr Deniece Ree is a 86 y.o. male with medical history significant of head and neck cancer status post left mandibular surgery in 2009 with subsequent chemo and radiation followed by Dr. Tammi Klippel, history of seizure, hyperlipidemia, Parkinson's disease, remote tobacco and alcohol use, hypertension who was brought in by the daughter due to generalized weakness failure to thrive and exertional dyspnea. Patient has been weak.  He is communicating but most history is obtained from the daughter who is at bedside.  He has apparently been declining with decreased oral intake over the last 2 weeks.  .  Was seen by ENT but no obvious cause of his dysphagia.  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  Also had his radiation therapy with plastic repair.  Now however he is declining.  In the ER evaluation showed a large right lung mass with large pleural effusion.  Also suspicious metastatic lesions to the bones of the thorax.  At this point patient is being admitted for evaluation and treatment.  In the ED, CT abdomen pelvis, CT lung angiogram, CT neck were done showing a 4.5 x 4.2 x 3.9 cm mass in the anterior right upper lobe with adjacent compressive atelectasis.  There is also a large right pleural effusion extending into the mediastinum with slight leftward shift of mediastinal structures.  There is also a 3.9 mm lesion in the liver and lytic destructive metastatic lesion within the thoracic spine for pelvis and sacrum as well as cervical spine.  Patient is being admitted therefore with suspicion of lung malignancy with metastasis.  Palliative consulted, further discussion with family occurred, switching patient to DNR and comfort care.    Today, patient denies any new  complaints.      Assessment/Plan: Principal Problem:   Mass of upper lobe of right lung Active Problems:   PARKINSON'S DISEASE   Cerebral artery occlusion with cerebral infarction (HCC)   PERIPHERAL VASCULAR DISEASE   Seizures (HCC)   Failure to thrive in adult   Pleural effusion   Primary malignant neoplasm of right lung metastatic to other site [facility])   Parkinson's disease ([place])   Advanced care planning/counseling discussion   Goals of care, counseling/discussion   Do not resuscitate   Terminal care   Palliative care by specialist   Mass in the right lung/pleural effusion Likely lung CA with metastasis, history of CVA of the mandible Status post right-sided thoracentesis, yielding 3.2 L of serosanguineous colored fluid Currently patient saturating well on room air, appears comfortable Palliative on board switch patient to comfort care, no further work-up Pain management  Failure to thrive Likely due to above Comfort caremeasures  ?Parkinson's disease Continue ropinirole  Squamous cell carcinoma of the left mandible Status post surgery/radiation Comfort care  History of CVA/PVD Comfort care DC Aggrenox  Seizure disorder Continue Keppra  Goals of care Poor prognosis, palliative on board, switched to comfort care Comfort care measures, Decadron, pain management, lorazepam,       Malnutrition Type:      Malnutrition Characteristics:      Nutrition Interventions:       Estimated body mass index is 27.12 kg/m as calculated from the following:   Height as of this encounter: 6' (1.829 m).   Weight as of this encounter: 90.7 kg.     Code  Status: DNR  Family Communication: Plan to discuss with family  Disposition Plan: SNF with hospice versus hospice medical facility   Consultants:  Palliative  IR  Procedures:  Thoracentesis  Antimicrobials:  None  DVT prophylaxis: SCDs   Objective: Vitals:   10/13/19 1500 10/13/19 2008  10/14/19 0408 10/14/19 1346  BP: (!) 124/56 (!) 125/56 128/72 110/76  Pulse: 89 92 85 (!) 106  Resp:  18 20 16   Temp: 97.7 F (36.5 C) 98.2 F (36.8 C) 98 F (36.7 C) 98.6 F (37 C)  TempSrc:  Oral Oral Oral  SpO2:  100% 100% 100%  Weight:      Height:        Intake/Output Summary (Last 24 hours) at 10/14/2019 1954 Last data filed at 10/14/2019 1500 Gross per 24 hour  Intake 1566.78 ml  Output 500 ml  Net 1066.78 ml   Filed Weights   10/12/19 1516  Weight: 90.7 kg    Exam:  General: NAD, deconditioned, chronically ill-appearing  Cardiovascular: S1, S2 present  Respiratory: CTAB  Abdomen: Soft, nontender, nondistended, bowel sounds present  Musculoskeletal: No bilateral pedal edema noted  Skin: Normal  Psychiatry: Normal mood   Data Reviewed: CBC: Recent Labs  Lab 10/12/19 1524 10/13/19 0237 10/14/19 0215  WBC 10.7* 10.0 10.6*  NEUTROABS  --   --  8.6*  HGB 13.3 14.2 13.3  HCT 42.9 44.2 40.7  MCV 101.2* 100.9* 99.0  PLT 463* 452* 258*   Basic Metabolic Panel: Recent Labs  Lab 10/12/19 1524 10/13/19 0237 10/14/19 0215  NA 140 139 139  K 3.9 4.2 4.0  CL 103 103 104  CO2 26 23 24   GLUCOSE 100* 96 136*  BUN 20 20 17   CREATININE 1.01 0.95 0.91  CALCIUM 9.7 9.6 9.2   GFR: Estimated Creatinine Clearance: 66.3 mL/min (by C-G formula based on SCr of 0.91 mg/dL). Liver Function Tests: Recent Labs  Lab 10/13/19 0237  AST 33  ALT 27  ALKPHOS 59  BILITOT 0.9  PROT 7.0  ALBUMIN 2.9*   No results for input(s): LIPASE, AMYLASE in the last 168 hours. No results for input(s): AMMONIA in the last 168 hours. Coagulation Profile: No results for input(s): INR, PROTIME in the last 168 hours. Cardiac Enzymes: No results for input(s): CKTOTAL, CKMB, CKMBINDEX, TROPONINI in the last 168 hours. BNP (last 3 results) No results for input(s): PROBNP in the last 8760 hours. HbA1C: No results for input(s): HGBA1C in the last 72 hours. CBG: No results for  input(s): GLUCAP in the last 168 hours. Lipid Profile: No results for input(s): CHOL, HDL, LDLCALC, TRIG, CHOLHDL, LDLDIRECT in the last 72 hours. Thyroid Function Tests: No results for input(s): TSH, T4TOTAL, FREET4, T3FREE, THYROIDAB in the last 72 hours. Anemia Panel: No results for input(s): VITAMINB12, FOLATE, FERRITIN, TIBC, IRON, RETICCTPCT in the last 72 hours. Urine analysis:    Component Value Date/Time   COLORURINE YELLOW 08/30/2008 2148   APPEARANCEUR CLEAR 08/30/2008 2148   LABSPEC 1.007 08/30/2008 2148   PHURINE 7.0 08/30/2008 2148   GLUCOSEU NEG mg/dL 08/30/2008 2148   BILIRUBINUR NEG 08/30/2008 2148   KETONESUR NEG mg/dL 08/30/2008 2148   PROTEINUR NEG mg/dL 08/30/2008 2148   UROBILINOGEN 0.2 08/30/2008 2148   NITRITE NEG 08/30/2008 2148   LEUKOCYTESUR SMALL (A) 08/30/2008 2148   Sepsis Labs: @LABRCNTIP (procalcitonin:4,lacticidven:4)  ) Recent Results (from the past 240 hour(s))  SARS CORONAVIRUS 2 (TAT 6-24 HRS) Nasopharyngeal Nasopharyngeal Swab     Status: None   Collection  Time: 10/13/19 12:21 AM   Specimen: Nasopharyngeal Swab  Result Value Ref Range Status   SARS Coronavirus 2 NEGATIVE NEGATIVE Final    Comment: (NOTE) SARS-CoV-2 target nucleic acids are NOT DETECTED. The SARS-CoV-2 RNA is generally detectable in upper and lower respiratory specimens during the acute phase of infection. Negative results do not preclude SARS-CoV-2 infection, do not rule out co-infections with other pathogens, and should not be used as the sole basis for treatment or other patient management decisions. Negative results must be combined with clinical observations, patient history, and epidemiological information. The expected result is Negative. Fact Sheet for Patients: SugarRoll.be Fact Sheet for Healthcare Providers: https://www.woods-mathews.com/ This test is not yet approved or cleared by the [place] FDA and  has been  authorized for detection and/or diagnosis of SARS-CoV-2 by FDA under an Emergency Use Authorization (EUA). This EUA will remain  in effect (meaning this test can be used) for the duration of the COVID-19 declaration under Section 56 4(b)(1) of the Act, 21 U.S.C. section 360bbb-3(b)(1), unless the authorization is terminated or revoked sooner. Performed at [facility], [place] [address], [place], [place] [address]   Gram stain     Status: None   Collection Time: 10/13/19  4:24 PM   Specimen: Lung, Right; Pleural Fluid  Result Value Ref Range Status   Specimen Description PLEURAL  Final   Special Requests RIGHT LUNG  Final   Gram Stain   Final    MODERATE WBC PRESENT, PREDOMINANTLY MONONUCLEAR NO ORGANISMS SEEN Performed at [facility], [place] [address], [place], [place] [address]    Report Status 10/13/2019 FINAL  Final      Studies: DG Chest 1 View  Result Date: 10/14/2019 CLINICAL DATA:  Pleural effusion EXAM: CHEST  1 VIEW COMPARISON:  10/13/2019 FINDINGS: Moderate layering right pleural effusion. No pneumothorax is seen on the current study, although this may be obscured. Stable mass in the medial right upper lobe. Left lung is clear. The heart is normal in size. IMPRESSION: Moderate layering right pleural effusion. No pneumothorax is seen on the current study, although possibly obscured. Stable mass in the medial right upper lobe. Electronically Signed   By: Julian Hy M.D.   On: 10/14/2019 07:37    Scheduled Meds: . dexamethasone  2 mg Oral BID WC  . feeding supplement (ENSURE ENLIVE)  237 mL Oral BID BM  . levETIRAcetam  500 mg Oral BID  . loratadine  10 mg Oral Daily  . megestrol  400 mg Oral BID  . polyvinyl alcohol  1 drop Both Eyes q AM  . rOPINIRole  1 mg Oral TID  . sodium chloride flush  3 mL Intravenous Once    Continuous Infusions: . sodium chloride 50 mL/hr at 10/14/19 1500     LOS: 1 day     Alma Friendly, MD [facility]  If 7PM-7AM, please contact night-coverage [email or web address] 10/14/2019, 7:54 PM

## 2019-10-14 NOTE — Progress Notes (Signed)
Visited with Chase Berger. He said he was doing good today. He let me know that he and his wife have prayer every morning, but said he'd like for me to pray with him too. I will revisit with spiritual care.  Rev. [place].

## 2019-10-14 NOTE — Consult Note (Signed)
Consultation Note Date: 10/14/2019   Patient Name: Chase Berger  DOB: [birth year]  MRN: [id number]  Age / Sex: 86 y.o., male  PCP: [name], MD Referring Physician: Alma Friendly, MD  Reason for Consultation: Establishing goals of care  HPI/Patient Profile: 86 y.o. male  with past medical history of Parkinson's, L mandible carcinoma s/p radiation tx in 2010, seizures, HTN  admitted on 10/12/2019 with worsening shortness of breath, and decreased oral intake. Workup has revealed large lung mass with pleural effusion, and possibly metastatic cancer to liver, and lytic lesions in the pelvis, thoracic spine, and cervical spine. Patient is s/p thoracentesis with approx 3 L off- pathology is pending. Palliative medicine consulted for [place].    Clinical Assessment and Goals of Care:  I have reviewed medical records including EPIC notes, labs and imaging, examined the patient and met at bedside with patient's daughters  to discuss diagnosis prognosis, GOC, EOL wishes, disposition and options.  I introduced Palliative Medicine as specialized medical care for people living with serious illness. It focuses on providing relief from the symptoms and stress of a serious illness.   We discussed a brief life review of the patient. He has six children and is married. He managed a Scientist, water quality.   As far as functional and nutritional status- there has been significant decline over the last several months and weeks. He is only able to ambulate from bed to bathroom. Spends most of his day sitting up watching tv whereas he previously enjoyed working puzzles and reading the newspaper. His po intake has decreased significantly to where he is only eating soft foods and liquids.    We discussed his current illness and what it means in the larger context of his on-going co-morbidities.  Natural disease trajectory and  expectations at EOL were discussed.  I attempted to elicit values and goals of care important to the patient and family. Their priority is that patient remain comfortable and without suffering as he declines towards end of life. They do not want further diagnostic testing. If he were to develop an illness such as pneumonia- they would want comfort measures only.  The difference between aggressive medical intervention and comfort care was considered in light of the patient's goals of care. All agreed that full comfort care only is goal of care.   Advanced directives, concepts specific to code status, artifical feeding and hydration, and rehospitalization were considered and discussed. Plan was made for DNR, no artificial feeding or hydration, no rehospitalization. As far as disposition- they do not feel that patient can safely be cared for in the home, even with Hospice support. Patient does not yet appear to be eligible for residential hospice. We discussed privately paying for LTC bed with Hospice- family would like to discuss this with [facility] team.   Questions and concerns were addressed.  The family was encouraged to call with questions or concerns.    Primary Decision Maker HCPOA- patient's daughter- Ronal Berger    SUMMARY OF RECOMMENDATIONS -Metastatic cancer-  family elects comfort care measures only, no further diagnostic workup or treatment -Disposition- TOC referral made to discuss privately paid SNF with Hospice -Symptom management- pain- dexamethasone 42m po with breakfast and lunch, morphine concentrate 562msublingual q2hr prn- SOB- morphine concentrate, agitation/anxiety- lorazepam 36m46m4hr prn IV Or sublingual -Seizure disorder- continue current seizure medications -Parkinson's- continue requip -Chaplain consult for spiritual support visit    Code Status/Advance Care Planning:  DNR   Palliative Prophylaxis:   Frequent Pain Assessment  Additional Recommendations  (Limitations, Scope, Preferences):  Avoid Hospitalization, Full Comfort Care and No Diagnostics  Psycho-social/Spiritual:   Desire for further Chaplaincy support:yes   Prognosis:    < 3 months due to metastatic cancer with evidence of recent decline in cognition, nutrition, and function- no plans for treatment or further diagnostic workup, comfort measures only  Discharge Planning: To Be Determined  Primary Diagnoses: Present on Admission: . PARKINSON'S DISEASE . Cerebral artery occlusion with cerebral infarction ([facility] PERIPHERAL VASCULAR DISEASE . Mass of upper lobe of right lung   I have reviewed the medical record, interviewed the patient and family, and examined the patient. The following aspects are pertinent.  Past Medical History:  Diagnosis Date  . Allergic rhinitis 09/16/2008  . CVA (cerebral infarction) 06/30/2007  . H/O: iron deficiency anemia 08/30/2008  . Hx of radiation therapy 04/12/09 -05/25/09   left infratemporal fossa  . Hyperlipidemia 09/01/2008  . 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 Malignant neoplasm of mandible ([facility]CC[facility]1/25/2009   left  . Parkinson disease ([facility]1/17/2008  . Peripheral vascular disease ([facility]CC[facility]1/07/2006  . Seizure disorder ([facility]1/07/2006  . Smoker 06/24/2006  . Stroke ([facility] . 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 Urinary frequency 08/30/2008   Social History   Socioeconomic History  . Marital status: Married    Spouse name: [name]  . Number of children: 6  . Years of education: HS  . Highest education level: Not on file  Occupational History  . Occupation: Retired    EmpFish farm managerETIRED  Tobacco Use  . Smoking status: Former Smoker    Years: 50.00    Types: Cigarettes    Quit date: 11/12/2007    Years since quitting: 11.9  .  Smokeless tobacco: Former UseSystems developer Quit date: 10/01/1999  Substance and Sexual Activity  . Alcohol use: No    Alcohol/week: 0.0 standard drinks  . Drug use: No  . Sexual activity: Not Currently  Other Topics Concern  . Not on file  Social History Narrative   Patient lives at home with spouse. ( [name] ).   Retired.   Education high school.   Right handed.   Caffeine two cups daily of coffee.   Social Determinants of Health   Financial Resource Strain:   . Difficulty of Paying Living Expenses: Not on file  Food Insecurity:   . Worried About RunCharity fundraiser the Last Year: Not on file  . Ran Out of Food in the Last Year: Not on file  Transportation Needs:   . Lack of Transportation (Medical): Not on file  . Lack of Transportation (Non-Medical): Not on file  Physical Activity:   . Days of Exercise per Week: Not on file  . Minutes of Exercise per Session: Not on file  Stress:   .  Feeling of Stress : Not on file  Social Connections:   . Frequency of Communication with Friends and Family: Not on file  . Frequency of Social Gatherings with Friends and Family: Not on file  . Attends Religious Services: Not on file  . Active Member of Clubs or Organizations: Not on file  . Attends Archivist Meetings: Not on file  . Marital Status: Not on file   Family History  Problem Relation Age of Onset  . Heart disease Mother   . Heart attack Mother   . Heart disease Father   . Heart attack Father   . Colon cancer Neg Hx    Scheduled Meds: . levETIRAcetam  500 mg Oral BID  . loratadine  10 mg Oral Daily  . megestrol  400 mg Oral BID  . polyvinyl alcohol  1 drop Both Eyes q AM  . rOPINIRole  1 mg Oral TID  . sodium chloride flush  3 mL Intravenous Once   Continuous Infusions: . sodium chloride 50 mL/hr at 10/13/19 1006   PRN Meds:.HYDROmorphone (DILAUDID) injection, lidocaine (PF), magnesium hydroxide, ondansetron **OR** ondansetron (ZOFRAN) IV Medications Prior to  Admission:  Prior to Admission medications   Medication Sig Start Date End Date Taking? Authorizing Provider  Carboxymethylcellul-Glycerin (REFRESH OPTIVE SENSITIVE OP) Place 1 drop into both eyes in the morning and at bedtime.   Yes [provider]  Carboxymethylcellulose Sodium (REFRESH LIQUIGEL) 1 % GEL Place 1 drop into both eyes in the morning.   Yes [provider]  cetirizine (ZYRTEC) 10 MG tablet Take 10 mg by mouth daily as needed for allergies.    Yes [provider]  ciprofloxacin (CIPRO) 250 MG tablet Take 250 mg by mouth 2 (two) times daily. FOR 5 DAYS 10/07/19 11/10/19 Yes [provider]  dipyridamole-aspirin (AGGRENOX) 200-25 MG 12hr capsule Take 1 capsule by mouth 2 (two) times daily. Please call [phone] to schedule yearly follow up appt. Patient taking differently: Take 1 capsule by mouth 2 (two) times daily.  09/03/19  Yes Suzzanne Cloud, NP  KEPPRA 500 MG tablet 1 tab in am and 2 tabs in pm Patient taking differently: Take 500-1,000 mg by mouth See admin instructions. Take 500 mg by mouth in the morning and 1,000 mg in the evening 09/03/19  Yes Suzzanne Cloud, NP  magnesium hydroxide (MILK OF MAGNESIA) 400 MG/5ML suspension Take 15-30 mLs by mouth daily as needed for mild constipation.    Yes [provider]  pravastatin (PRAVACHOL) 20 MG tablet take 1 tablet by mouth once daily Patient taking differently: Take 20 mg by mouth daily.  10/07/15  Yes Corky Sox, MD  rOPINIRole (REQUIP) 1 MG tablet TAKE 1 TABLET(1 MG) BY MOUTH THREE TIMES DAILY Patient taking differently: Take 1 mg by mouth 3 (three) times daily.  09/03/19  Yes Suzzanne Cloud, NP   No Known Allergies Review of Systems  Unable to perform ROS   Physical Exam Vitals and nursing note reviewed.  Constitutional:      Appearance: Normal appearance.  Cardiovascular:     Rate and Rhythm: Normal rate.  Pulmonary:     Effort: Pulmonary effort is normal.  Neurological:      Comments: UTA orientation- per family confused at times, unintelligible speech  Psychiatric:        Behavior: Behavior normal.     Vital Signs: BP 128/72 (BP Location: Left Wrist)   Pulse 85   Temp 98 F (36.7 C) (  Oral)   Resp 20   Ht 6' (1.829 m)   Wt 90.7 kg   SpO2 100%   BMI 27.12 kg/m  Pain Scale: 0-10   Pain Score: Asleep   SpO2: SpO2: 100 % O2 Device:SpO2: 100 % O2 Flow Rate: .O2 Flow Rate (L/min): 2 L/min  IO: Intake/output summary:   Intake/Output Summary (Last 24 hours) at 10/14/2019 1052 Last data filed at 10/14/2019 0620 Gross per 24 hour  Intake 800 ml  Output 3700 ml  Net -2900 ml    LBM: Last BM Date: (UTA) Baseline Weight: Weight: 90.7 kg Most recent weight: Weight: 90.7 kg     Palliative Assessment/Data: PPS: 40%     Thank you for this consult. Palliative medicine will continue to follow and assist as needed.   Time In: 1000 Time Out: 1115 Time Total: 75 minutes Greater than 50%  of this time was spent counseling and coordinating care related to the above assessment and plan.  Signed by: Mariana Kaufman, AGNP-C Palliative Medicine    Please contact Palliative Medicine Team phone at [phone] for questions and concerns.  For individual provider: See Shea Evans

## 2019-10-15 DIAGNOSIS — Z515 Encounter for palliative care: Secondary | ICD-10-CM

## 2019-10-15 DIAGNOSIS — C3491 Malignant neoplasm of unspecified part of right bronchus or lung: Secondary | ICD-10-CM

## 2019-10-15 DIAGNOSIS — G20 Parkinson's disease: Secondary | ICD-10-CM

## 2019-10-15 DIAGNOSIS — Z66 Do not resuscitate: Secondary | ICD-10-CM

## 2019-10-15 DIAGNOSIS — Z7189 Other specified counseling: Secondary | ICD-10-CM

## 2019-10-15 LAB — CD19 AND CD20, FLOW CYTOMETRY

## 2019-10-15 MED ORDER — DEXAMETHASONE 2 MG PO TABS: 2.0000 mg | ORAL_TABLET | Freq: Two times a day (BID) | ORAL | Status: AC

## 2019-10-15 MED ORDER — ENSURE ENLIVE PO LIQD: 237.0000 mL | Freq: Two times a day (BID) | ORAL | 12 refills | Status: AC

## 2019-10-15 MED ORDER — PROMETHAZINE HCL 25 MG/ML IJ SOLN
12.5000 mg | Freq: Four times a day (QID) | INTRAMUSCULAR | Status: DC | PRN
  Administered 2019-10-15: 12.5 mg via INTRAVENOUS
  Filled 2019-10-15: qty 1

## 2019-10-15 MED ORDER — MEGESTROL ACETATE 400 MG/10ML PO SUSP: 400.0000 mg | Freq: Two times a day (BID) | ORAL | 0 refills | Status: AC

## 2019-10-15 MED ORDER — BISACODYL 10 MG RE SUPP: 10.0000 mg | Freq: Once | RECTAL | Status: DC

## 2019-10-15 NOTE — Discharge Summary (Signed)
Discharge Summary  [name]:[id number] DOB: [birth year]  PCP: [name], MD  Admit date: 10/12/2019 Discharge date: 10/15/2019  Time spent: 40 mins  Recommendations for Outpatient Follow-up:  1. Currently on hospice  Discharge Diagnoses:  Active Hospital Problems   Diagnosis Date Noted  . Mass of upper lobe of right lung 10/13/2019  . Failure to thrive in adult   . Pleural effusion   . Primary malignant neoplasm of right lung metastatic to other site [facility])   . Parkinson's disease ([place])   . Advanced care planning/counseling discussion   . Goals of care, counseling/discussion   . Do not resuscitate   . Terminal care   . Palliative care by specialist   . Seizures ([place]) 08/28/2016  . PARKINSON'S DISEASE 06/30/2007  . Cerebral artery occlusion with cerebral infarction ([place]) 06/30/2007  . PERIPHERAL VASCULAR DISEASE 06/24/2006    Resolved Hospital Problems  No resolved problems to display.    Discharge Condition: Fair  Diet recommendation:  As tolerated  Vitals:   10/14/19 1346 10/14/19 2123  BP: 110/76 (!) 125/99  Pulse: (!) 106 (!) 107  Resp: 16 20  Temp: 98.6 F (37 C) 97.8 F (36.6 C)  SpO2: 100% 100%    History of present illness:  Chase Berger a 86 y.o.malewith medical history significant ofhead and neck cancer status post left mandibular surgery in 2009 with subsequent chemo and radiation followed by Dr. Tammi Klippel, history of seizure, hyperlipidemia, Parkinson's disease, remote tobacco and alcohol use, hypertension who was brought in by the daughter due to generalized weakness failure to thrive and exertional dyspnea. Patient has been weak. He is communicating but most history is obtained from the daughter who is at bedside. He has apparently been declining with decreased oral intake over the last 2 weeks. . Was seen by ENT but no obvious cause of his dysphagia. 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 Also had his radiation therapy with plastic repair. Now however he is declining. In the ER evaluation showed a large right lung mass with large pleural effusion. Also suspicious metastatic lesions to the bones of the thorax. At this point patient is being admitted for evaluation and treatment.  In the ED, CT abdomen pelvis, CT lung angiogram, CT neck were done showing a 4.5 x 4.2 x 3.9 cm mass in the anterior right upper lobe with adjacent compressive atelectasis. There is also a large right pleural effusion extending into the mediastinum with slight leftward shift of mediastinal structures. There is also a 3.9 mm lesion in the liver and lytic destructive metastatic lesion within the thoracic spine for pelvis and sacrum as well as cervical spine. Patient is being admitted therefore with suspicion of lung malignancy with metastasis.  Palliative consulted, further discussion with family occurred, switching patient to DNR and comfort care.    Today, pt noted to have nausea and vomiting with some bowel distension. Pt denies any abdominal pain, chest pain, SOB, fever/chills   Hospital Course:  Principal Problem:   Mass of upper lobe of right lung Active Problems:   PARKINSON'S DISEASE   Cerebral artery occlusion with cerebral infarction ([facility])   PERIPHERAL VASCULAR DISEASE   Seizures ([facility])   Failure to thrive in adult   Pleural effusion   Primary malignant neoplasm of right lung metastatic to other site [facility])   Parkinson's disease ([place])   Advanced care planning/counseling discussion   Goals of care, counseling/discussion   Do  not resuscitate   Terminal care   Palliative care by specialist    Mass in the right lung/pleural effusion/with metastatic lesion to possible liver/spine/sacrum Likely lung CA with metastasis, history of CVA of the mandible Status post right-sided thoracentesis, yielding 3.2 L of serosanguineous colored fluid Currently patient saturating well on  room air, appears comfortable Palliative on board switch patient to comfort care, no further work-up Pain management  Failure to thrive Likely due to above Comfort care measures  ?Parkinson's disease Continue ropinirole  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   Weight as of this encounter: 90.7 kg.    Procedures:  None  Consultations:  Palliative care  Discharge Exam: BP (!) 125/99 (BP Location: Left Arm)   Pulse (!) 107   Temp 97.8 F (36.6 C) (Oral)   Resp 20   Ht 6' (1.829 m)   Wt 90.7 kg   SpO2 100%   BMI 27.12 kg/m   General: NAD, chronically ill appearing, deconditioned   Cardiovascular: S1, S2 present Respiratory: CTAB Abdomen: Soft, mildly distended, non tender, BS present    Discharge Instructions You were cared for by a hospitalist during your hospital stay. If you have any questions about your discharge medications or the care you received while you were in the hospital after you are discharged, you can call the unit and asked to speak with the hospitalist on call if the hospitalist that took care of you is not available. Once you are discharged, your primary care physician will handle any further medical issues. Please note that NO REFILLS for any discharge medications will be authorized once you are discharged, as it is imperative that you return to your primary care physician (or establish a relationship with a primary care physician  if you do not have one) for your aftercare needs so that they can reassess your need for medications and monitor your lab values.  Discharge Instructions    Diet - low sodium heart healthy   Complete by: As directed    Increase activity slowly   Complete by: As directed      Allergies as of 10/15/2019   No Known Allergies     Medication List    STOP taking these medications   cetirizine 10 MG tablet Commonly known as: ZYRTEC   ciprofloxacin 250 MG tablet Commonly known as: CIPRO   dipyridamole-aspirin 200-25 MG 12hr capsule Commonly known as: AGGRENOX   Milk of Magnesia 400 MG/5ML suspension Generic drug: magnesium hydroxide   pravastatin 20 MG tablet Commonly known as: PRAVACHOL     TAKE these medications   dexamethasone 2 MG tablet Commonly known as: DECADRON Take 1 tablet (2 mg total) by mouth 2 (two) times daily with breakfast and lunch. Start taking on: October 16, 2019   feeding supplement (ENSURE ENLIVE) Liqd Take 237 mLs by mouth 2 (two) times daily between meals. Start taking on: October 16, 2019   Keppra 500 MG tablet Generic drug: levETIRAcetam 1  tab in am and 2 tabs in pm What changed:   how much to take  how to take this  when to take this  additional instructions   megestrol 400 MG/10ML suspension Commonly known as: MEGACE Take 10 mLs (400 mg total) by mouth 2 (two) times daily.   Refresh Liquigel 1 % Gel Generic drug: Carboxymethylcellulose Sodium Place 1 drop into both eyes in the morning.   REFRESH OPTIVE SENSITIVE OP Place 1 drop into both eyes in the morning and at bedtime.   rOPINIRole 1 MG tablet Commonly known as: REQUIP TAKE 1 TABLET(1 MG) BY MOUTH THREE TIMES DAILY What changed:   how much to take  how to take this  when to take this  additional instructions      No Known Allergies    The results of significant diagnostics from this hospitalization (including imaging, microbiology, ancillary and laboratory) are  listed below for reference.    Significant Diagnostic Studies: DG Chest 1 View  Result Date: 10/14/2019 CLINICAL DATA:  Pleural effusion EXAM: CHEST  1 VIEW COMPARISON:  10/13/2019 FINDINGS: Moderate layering right pleural effusion. No pneumothorax is seen on the current study, although this may be obscured. Stable mass in the medial right upper lobe. Left lung is clear. The heart is normal in size. IMPRESSION: Moderate layering right pleural effusion. No pneumothorax is seen on the current study, although possibly obscured. Stable mass in the medial right upper lobe. Electronically Signed   By: Julian Hy M.D.   On: 10/14/2019 07:37   DG Chest 1 View  Result Date: 10/13/2019 CLINICAL DATA:  Status post right thoracentesis today. EXAM: CHEST  1 VIEW COMPARISON:  Single-view of the chest 10/12/2019. FINDINGS: Right pleural effusion is decreased after thoracentesis. There is a new small right apical pneumothorax estimated at 5-10%. The left lung is expanded and clear. Heart size is upper normal. Atherosclerosis. IMPRESSION: Small right pneumothorax after thoracentesis. Critical Value/emergent results were called by telephone at the time of interpretation on 10/13/2019 at 1:44 pm to provider PAM TURPIN, PA, who verbally acknowledged these results. Electronically Signed   By: Inge Rise M.D.   On: 10/13/2019 13:44   DG Chest 2 View  Result Date: 10/12/2019 CLINICAL DATA:  Shortness of breath. EXAM: CHEST - 2 VIEW COMPARISON:  November 15, 2006. FINDINGS: The heart size and mediastinal contours are within normal limits. Left lung is clear. Large right pleural effusion is noted. Large rounded mass is noted in right upper lobe concerning for malignancy. The visualized skeletal structures are unremarkable. IMPRESSION: Large rounded mass seen in right upper lobe concerning for malignancy. Large right pleural effusion is noted. CT scan of the chest is recommended for further evaluation. These results will be  called to the ordering clinician or representative by the Radiologist Assistant, and communication documented in the PACS or zVision Dashboard. Electronically Signed   By: Marijo Conception M.D.   On: 10/12/2019 14:05   CT Head Wo Contrast  Result Date: 10/12/2019 CLINICAL DATA:  86 year old male with shortness of breath. EXAM: CT HEAD WITHOUT CONTRAST TECHNIQUE: Contiguous axial images were obtained from the base of the skull through the vertex without intravenous contrast. COMPARISON:  None. FINDINGS: Brain: There is mild age-related atrophy and chronic microvascular ischemic changes. There is dilatation of the ventricles out of proportion with the sulci which may represent central volume loss versus normal pressure hydrocephalus. Clinical correlation is recommended. There is no acute intracranial hemorrhage. No mass effect or midline shift.  Old area of infarct and encephalomalacia in the right temporal lobe versus less likely an arachnoid cyst. Vascular: No hyperdense vessel or unexpected calcification. Skull: Normal. Negative for fracture or focal lesion. Sinuses/Orbits: Diffuse mucoperiosteal thickening of paranasal sinuses. No air-fluid level. The mastoid air cells are clear. Other: None IMPRESSION: 1. No acute intracranial hemorrhage. 2. 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 Clinical correlation is recommended. Electronically Signed   By: Anner Crete M.D.   On: 10/12/2019 22:25   CT Soft Tissue Neck W Contrast  Result Date: 10/13/2019 CLINICAL DATA:  Initial evaluation for acute dysphagia, shortness of breath. 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 EXAM: CT NECK WITH CONTRAST TECHNIQUE: Multidetector CT imaging of the neck was performed using the standard protocol following the bolus administration of intravenous contrast. CONTRAST:  163mL OMNIPAQUE IOHEXOL 350 MG/ML SOLN  COMPARISON:  Prior CT of the chest, abdomen, and pelvis from 10/12/2019. FINDINGS: Pharynx and larynx: Oral cavity within normal limits without discrete mass or collection. Postoperative changes from previous left mandibulectomy with fat graft placement. No soft tissue mass to suggest locally recurrent disease. Postsurgical changes noted within the left infratemporal fossa. Remaining oropharynx and nasopharynx within normal limits. No retropharyngeal collection or swelling. Epiglottis normal. Vallecula clear. Mild pooling of secretions noted within the hypopharynx. Piriform sinuses are clear. Glottis within normal limits. Subglottic trachea deviated to the left and mildly narrowed at the level of the thyroid but otherwise widely patent. Salivary glands: Right parotid and submandibular glands are within normal limits. Left parotid and submandibular glands have been resected. Thyroid: Enlarged heterogeneous multinodular thyroid, suggesting multinodular goiter. Dominant nodule at the posterior right thyroid lobe measures up to 18 mm. Lymph nodes: No pathologically enlarged lymph nodes identified within the neck. Hazy soft tissue stranding seen at left level 2 and involving the left carotid space favored to be postoperative in nature. Vascular: Normal intravascular enhancement seen throughout the neck. Scattered atheromatous change seen about the aortic arch, carotid bifurcations, and carotid siphons. Limited intracranial: Age-related cerebral atrophy. Visualized portions of the brain otherwise grossly unremarkable. Visualized orbits: Prior bilateral ocular lens replacement. Globes and orbital soft tissues otherwise unremarkable. Mastoids and visualized paranasal sinuses: Mild to moderate chronic mucosal thickening noted within the ethmoidal air cells and maxillary sinuses. Small retention cyst noted at the left sphenoid sinus. Visualized paranasal sinuses are otherwise clear. Small chronic appearing left mastoid  effusion. Mastoid air cells and middle ear cavities are otherwise well pneumatized and clear. Skeleton: Asymmetric sclerosis seen involving the left middle cranial fossa and skull base, likely reflecting post radiation changes. Lytic destructive lesion involving the T3 vertebral body concerning for metastatic disease. Associated pathologic fracture with up to 30% height loss without significant bony retropulsion (series 15, image 49). 6 mm lucent lesion within the left aspect of the manubrium concerning for metastatic disease as well (series 18, image 127). No other acute osseous abnormality. Moderate cervical spondylosis present at C4-5 through C6-7. Patient is edentulous. Upper chest: Previously identified right upper lobe mass with associated large right pleural effusion partially visualize, better evaluated on prior CT of the chest. Other: None. IMPRESSION: 1. No acute soft tissue abnormality within the neck. 2. Postoperative changes from previous left mandibulectomy. No evidence for locally recurrent disease. 3. Lytic metastatic lesion involving the T3 vertebral body with associated pathologic fracture and 30% height loss. Probable additional 6 mm lytic metastasis involving the left manubrium. 4.  Previously identified right upper lobe mass with associated large right pleural effusion, better evaluated on prior CT of the chest. 5. Enlarged multinodular thyroid, with dominant 18 mm right thyroid nodule. Further assessment with dedicated thyroid ultrasound recommended if not already performed. This could be performed on a nonemergent outpatient basis. Electronically Signed   By: Jeannine Boga M.D.   On: 10/13/2019 01:07   CT Angio Chest PE W and/or Wo Contrast  Result Date: 10/13/2019 CLINICAL DATA:  Dysplasia, right lobe mass and shortness of breath, history of left mandibular sarcomatoid cancer EXAM: CT chest, ABDOMEN AND PELVIS WITH CONTRAST TECHNIQUE: Multidetector CT imaging of the chest, abdomen  and pelvis was performed using the standard protocol following bolus administration of intravenous contrast. CONTRAST:  169mL OMNIPAQUE IOHEXOL 350 MG/ML SOLN COMPARISON:  Radiograph same day FINDINGS: Cardiovascular: There is a optimal opacification of the pulmonary arteries. There is no central,segmental, or subsegmental filling defects within the pulmonary arteries. The heart is normal in size. No pericardial effusion or thickening. No evidence right heart strain. There is normal three-vessel brachiocephalic anatomy without proximal stenosis. Coronary artery calcifications are seen. There is scattered aortic atherosclerosis noted. Mediastinum/Nodes: There is heterogeneously enlarged bilateral thyroid lobes, right greater the left with punctate calcifications seen. The large amount of pleural fluid extends into the mediastinum and causes leftward deviation of the mediastinal structures. Lungs/Pleura: Focal rounded heterogeneous mass within the anterior right upper lobe which measures 4.5 x 4.2 x 3.9 cm. Surrounding small amount of spiculated opacity is noted. There is compressive atelectasis seen within the right middle lobe and right lower lobe. A large right pleural effusion is noted which extends into the mediastinum and causes leftward deviation. Small amount of left basilar ground-glass atelectasis is noted. Musculoskeletal: There is a lytic lesion seen in the superior left T3 vertebral body causing cortical breakthrough. There does appear to be some extension into the inter disc space at T3-T4. Mild prevertebral soft tissue swelling is noted. There is also a small lytic lesion seen within the inferior T10 vertebral body. Review of the MIP images confirms the above findings. Abdomen/pelvis: Hepatobiliary: There is a slightly nodular liver contour present. Within the posterior right liver lobe there is a 9 mm hypodense lesion which does not appear to be present on prior exam dating back to 2008.the main portal  vein is patent. No evidence of calcified gallstones, gallbladder wall thickening or biliary dilatation. Pancreas: Unremarkable. No pancreatic ductal dilatation or surrounding inflammatory changes. Spleen: Normal in size without focal abnormality. Adrenals/Urinary Tract: Both adrenal glands appear normal. The kidneys and collecting system appear normal without evidence of urinary tract calculus or hydronephrosis. Bladder is unremarkable. Stomach/Bowel: The stomach, small bowel, and colon are normal in appearance. No inflammatory changes, wall thickening, or obstructive findings.The appendix is normal. Vascular/Lymphatic: There are no enlarged mesenteric, retroperitoneal, or pelvic lymph nodes. Scattered aortic atherosclerotic calcifications are seen without aneurysmal dilatation. Reproductive: The prostate is unremarkable. Other: No evidence of abdominal wall mass or hernia. Musculoskeletal: There is a lytic destructive mass seen at the posterior right inferior ilium adjacent to the sacroiliac joint with soft tissue extends into the gluteal musculature. There is also lytic lesions in the superior right iliac wing with periosteal reaction. There is also adjacent lytic lesion involving the inferior right sacrum, bilateral sacral ala and inferior left iliac wing. IMPRESSION: 1. Rounded heterogeneous mass in the anterior right upper lobe measuring 4.5 x 4.2 x 3.9 cm with adjacent compressive atelectasis, consistent with pulmonary neoplasm. 2. Large  right probable malignant effusion extending into the mediastinum and causing slight leftward shift of mediastinal structures. 3. 9 mm hypodense lesion in the superior right liver, not seen on prior exam dating back to 2008, concerning for metastatic disease. 4. Lytic destructive osseous metastatic lesions within the thoracic spine, pelvis, and sacrum as described above. Electronically Signed   By: Prudencio Pair M.D.   On: 10/13/2019 00:16   CT ABDOMEN PELVIS W  CONTRAST  Result Date: 10/13/2019 CLINICAL DATA:  Dysplasia, right lobe mass and shortness of breath, history of left mandibular sarcomatoid cancer EXAM: CT chest, ABDOMEN AND PELVIS WITH CONTRAST TECHNIQUE: Multidetector CT imaging of the chest, abdomen and pelvis was performed using the standard protocol following bolus administration of intravenous contrast. CONTRAST:  153mL OMNIPAQUE IOHEXOL 350 MG/ML SOLN COMPARISON:  Radiograph same day FINDINGS: Cardiovascular: There is a optimal opacification of the pulmonary arteries. There is no central,segmental, or subsegmental filling defects within the pulmonary arteries. The heart is normal in size. No pericardial effusion or thickening. No evidence right heart strain. There is normal three-vessel brachiocephalic anatomy without proximal stenosis. Coronary artery calcifications are seen. There is scattered aortic atherosclerosis noted. Mediastinum/Nodes: There is heterogeneously enlarged bilateral thyroid lobes, right greater the left with punctate calcifications seen. The large amount of pleural fluid extends into the mediastinum and causes leftward deviation of the mediastinal structures. Lungs/Pleura: Focal rounded heterogeneous mass within the anterior right upper lobe which measures 4.5 x 4.2 x 3.9 cm. Surrounding small amount of spiculated opacity is noted. There is compressive atelectasis seen within the right middle lobe and right lower lobe. A large right pleural effusion is noted which extends into the mediastinum and causes leftward deviation. Small amount of left basilar ground-glass atelectasis is noted. Musculoskeletal: There is a lytic lesion seen in the superior left T3 vertebral body causing cortical breakthrough. There does appear to be some extension into the inter disc space at T3-T4. Mild prevertebral soft tissue swelling is noted. There is also a small lytic lesion seen within the inferior T10 vertebral body. Review of the MIP images confirms  the above findings. Abdomen/pelvis: Hepatobiliary: There is a slightly nodular liver contour present. Within the posterior right liver lobe there is a 9 mm hypodense lesion which does not appear to be present on prior exam dating back to 2008.the main portal vein is patent. No evidence of calcified gallstones, gallbladder wall thickening or biliary dilatation. Pancreas: Unremarkable. No pancreatic ductal dilatation or surrounding inflammatory changes. Spleen: Normal in size without focal abnormality. Adrenals/Urinary Tract: Both adrenal glands appear normal. The kidneys and collecting system appear normal without evidence of urinary tract calculus or hydronephrosis. Bladder is unremarkable. Stomach/Bowel: The stomach, small bowel, and colon are normal in appearance. No inflammatory changes, wall thickening, or obstructive findings.The appendix is normal. Vascular/Lymphatic: There are no enlarged mesenteric, retroperitoneal, or pelvic lymph nodes. Scattered aortic atherosclerotic calcifications are seen without aneurysmal dilatation. Reproductive: The prostate is unremarkable. Other: No evidence of abdominal wall mass or hernia. Musculoskeletal: There is a lytic destructive mass seen at the posterior right inferior ilium adjacent to the sacroiliac joint with soft tissue extends into the gluteal musculature. There is also lytic lesions in the superior right iliac wing with periosteal reaction. There is also adjacent lytic lesion involving the inferior right sacrum, bilateral sacral ala and inferior left iliac wing. IMPRESSION: 1. Rounded heterogeneous mass in the anterior right upper lobe measuring 4.5 x 4.2 x 3.9 cm with adjacent compressive atelectasis, consistent with pulmonary neoplasm. 2.  Large right probable malignant effusion extending into the mediastinum and causing slight leftward shift of mediastinal structures. 3. 9 mm hypodense lesion in the superior right liver, not seen on prior exam dating back to  2008, concerning for metastatic disease. 4. Lytic destructive osseous metastatic lesions within the thoracic spine, pelvis, and sacrum as described above. Electronically Signed   By: Prudencio Pair M.D.   On: 10/13/2019 00:16   IR THORACENTESIS ASP PLEURAL SPACE W/IMG GUIDE  Result Date: 10/13/2019 INDICATION: Patient with history of head and neck cancer presents with a right sided pleural effusion team is requesting a therapeutic and diagnostic thoracentesis. EXAM: ULTRASOUND GUIDED THERAPEUTIC AND DIAGNOSTIC THORACENTESIS MEDICATIONS: Lidocaine 1% 10 mL COMPLICATIONS: Small apical pneumothorax PROCEDURE: An ultrasound guided thoracentesis was thoroughly discussed with the patient and questions answered. The benefits, risks, alternatives and complications were also discussed. The patient understands and wishes to proceed with the procedure. Written consent was obtained. Ultrasound was performed to localize and mark an adequate pocket of fluid in the right chest. The area was then prepped and draped in the normal sterile fashion. 1% Lidocaine was used for local anesthesia. Under ultrasound guidance a 6 Fr Safe-T-Centesis catheter was introduced. Thoracentesis was performed. The catheter was removed and a dressing applied. FINDINGS: A total of approximately 3.2 L of serosanguineous fluid was removed. Samples were sent to the laboratory as requested by the clinical team. IMPRESSION: Successful ultrasound guided therapeutic and diagnostic right sided thoracentesis yielding 3.2 L of pleural fluid. Read by Rushie Nyhan NP Electronically Signed   By: Corrie Mckusick D.O.   On: 10/13/2019 14:30    Microbiology: Recent Results (from the past 240 hour(s))  SARS CORONAVIRUS 2 (TAT 6-24 HRS) Nasopharyngeal Nasopharyngeal Swab     Status: None   Collection Time: 10/13/19 12:21 AM   Specimen: Nasopharyngeal Swab  Result Value Ref Range Status   SARS Coronavirus 2 NEGATIVE NEGATIVE Final    Comment:  (NOTE) SARS-CoV-2 target nucleic acids are NOT DETECTED. The SARS-CoV-2 RNA is generally detectable in upper and lower respiratory specimens during the acute phase of infection. Negative results do not preclude SARS-CoV-2 infection, do not rule out co-infections with other pathogens, and should not be used as the sole basis for treatment or other patient management decisions. Negative results must be combined with clinical observations, patient history, and epidemiological information. The expected result is Negative. Fact Sheet for Patients: SugarRoll.be Fact Sheet for Healthcare Providers: https://www.woods-mathews.com/ This test is not yet approved or cleared by the [place] FDA and  has been authorized for detection and/or diagnosis of SARS-CoV-2 by FDA under an Emergency Use Authorization (EUA). This EUA will remain  in effect (meaning this test can be used) for the duration of the COVID-19 declaration under Section 56 4(b)(1) of the Act, 21 U.S.C. section 360bbb-3(b)(1), unless the authorization is terminated or revoked sooner. Performed at [facility], [place] [address], [place], [place] [address]   Gram stain     Status: None   Collection Time: 10/13/19  4:24 PM   Specimen: Lung, Right; Pleural Fluid  Result Value Ref Range Status   Specimen Description PLEURAL  Final   Special Requests RIGHT LUNG  Final   Gram Stain   Final    MODERATE WBC PRESENT, PREDOMINANTLY MONONUCLEAR NO ORGANISMS SEEN Performed at [facility], [place] [address], [place], [place] [address]    Report Status 10/13/2019 FINAL  Final  Acid Fast Smear (AFB)     Status:  None   Collection Time: 10/13/19  4:24 PM   Specimen: Lung, Right; Pleural Fluid  Result Value Ref Range Status   AFB Specimen Processing Concentration  Final   Acid Fast Smear Negative  Final    Comment: (NOTE) Performed At: [facility] [place], [place]  [id number] [place] MD EE:[id number]    Source (AFB) PLEURAL  Final    Comment: RIGHT LUNG Performed at [facility], [place] [address], [place], [place] [address]      Labs: Basic Metabolic Panel: Recent Labs  Lab 10/12/19 1524 10/13/19 0237 10/14/19 0215  NA 140 139 139  K 3.9 4.2 4.0  CL 103 103 104  CO2 26 23 24   GLUCOSE 100* 96 136*  BUN 20 20 17   CREATININE 1.01 0.95 0.91  CALCIUM 9.7 9.6 9.2   Liver Function Tests: Recent Labs  Lab 10/13/19 0237  AST 33  ALT 27  ALKPHOS 59  BILITOT 0.9  PROT 7.0  ALBUMIN 2.9*   No results for input(s): LIPASE, AMYLASE in the last 168 hours. No results for input(s): AMMONIA in the last 168 hours. CBC: Recent Labs  Lab 10/12/19 1524 10/13/19 0237 10/14/19 0215  WBC 10.7* 10.0 10.6*  NEUTROABS  --   --  8.6*  HGB 13.3 14.2 13.3  HCT 42.9 44.2 40.7  MCV 101.2* 100.9* 99.0  PLT 463* 452* 419*   Cardiac Enzymes: No results for input(s): CKTOTAL, CKMB, CKMBINDEX, TROPONINI in the last 168 hours. BNP: BNP (last 3 results) No results for input(s): BNP in the last 8760 hours.  ProBNP (last 3 results) No results for input(s): PROBNP in the last 8760 hours.  CBG: No results for input(s): GLUCAP in the last 168 hours.     Signed:  Alma Friendly, MD [facility] 10/15/2019, 2:29 PM

## 2019-10-15 NOTE — TOC Transition Note (Signed)
Transition of Care [facility]) - CM/SW Discharge Note   Patient Details  Name: Chase Berger MRN: [id number] Date of Birth: 05-30-1933  [facility] CM/SW Contact:  Alexander Mt, LCSW Phone Number: 10/15/2019, 3:32 PM   Clinical Narrative:    Pt has bed at [facility]. Pt daughter has completed paperwork. Bed available for tx. [facility] called, dc summary faxed. Pt MD will complete DNR, bedside RN Daisy aware.   Final next level of care: [place] Barriers to Discharge: Barriers Resolved   Patient Goals and CMS Choice Patient states their goals for this hospitalization and ongoing recovery are:: for him to be comfortable and to have assistance with caregiving CMS Medicare.gov Compare Post Acute Care list provided to:: (n/a) Choice offered to / list presented to : Adult Children  Discharge Placement    Patient chooses bed at: Other - please specify in the comment section below:([facility]) Patient to be transferred to facility by: [facility] Name of family member notified: pt daughter has completed transfer paperwork Patient and family notified of of transfer: 10/15/19  Discharge Plan and Services In-house Referral: Clinical Social Work Discharge Planning Services: CM Consult Post Acute Care Choice: Hospice, [place]           Readmission Risk Interventions Readmission Risk Prevention Plan 10/14/2019  Post Dischage Appt Not Complete  Appt Comments plan for comfort care  Medication Screening Complete  Transportation Screening Complete  Some recent data might be hidden

## 2019-10-15 NOTE — Progress Notes (Addendum)
Daily Progress Note   Patient Name: Chase Berger       Date: 10/15/2019 DOB: 1933/03/05  Age: 86 y.o. MRN#: [id number] Attending Physician: Alma Friendly, MD Primary Care Physician: [name], MD Admit Date: 10/12/2019  Reason for Consultation/Follow-up: Establishing goals of care  Subjective: Patient declining. He is not eating. Drinking some. Has an episode of emesis this morning- high risk for aspiration that has probably already happened.   ROS  Length of Stay: 2  Current Medications: Scheduled Meds:  . bisacodyl  10 mg Rectal Once  . dexamethasone  2 mg Oral BID WC  . feeding supplement (ENSURE ENLIVE)  237 mL Oral BID BM  . levETIRAcetam  500 mg Oral BID  . loratadine  10 mg Oral Daily  . megestrol  400 mg Oral BID  . polyvinyl alcohol  1 drop Both Eyes q AM  . rOPINIRole  1 mg Oral TID  . sodium chloride flush  3 mL Intravenous Once    Continuous Infusions: . sodium chloride 50 mL/hr at 10/15/19 0528    PRN Meds: atropine, HYDROmorphone (DILAUDID) injection, lidocaine (PF), LORazepam **OR** LORazepam **OR** LORazepam, magnesium hydroxide, morphine CONCENTRATE **OR** morphine CONCENTRATE, ondansetron **OR** ondansetron (ZOFRAN) IV, promethazine  Physical Exam Vitals and nursing note reviewed.  Constitutional:      Comments: asleep  Pulmonary:     Effort: Pulmonary effort is normal.             Vital Signs: BP (!) 125/99 (BP Location: Left Arm)   Pulse (!) 107   Temp 97.8 F (36.6 C) (Oral)   Resp 20   Ht 6' (1.829 m)   Wt 90.7 kg   SpO2 100%   BMI 27.12 kg/m  SpO2: SpO2: 100 % O2 Device: O2 Device: Nasal Cannula O2 Flow Rate: O2 Flow Rate (L/min): 1.5 L/min  Intake/output summary:   Intake/Output Summary (Last 24 hours) at 10/15/2019  1227 Last data filed at 10/15/2019 1137 Gross per 24 hour  Intake 666.78 ml  Output 175 ml  Net 491.78 ml   LBM: Last BM Date: (UTA) Baseline Weight: Weight: 90.7 kg Most recent weight: Weight: 90.7 kg       Palliative Assessment/Data:PPS: 30%     Patient Active Problem List   Diagnosis Date Noted  . Failure to thrive  in adult   . Pleural effusion   . Primary malignant neoplasm of right lung metastatic to other site [facility])   . Parkinson's disease ([place])   . Advanced care planning/counseling discussion   . Goals of care, counseling/discussion   . Do not resuscitate   . Terminal care   . Palliative care by specialist   . Mass of upper lobe of right lung 10/13/2019  . Seizures ([place]) 08/28/2016  . Alcohol use disorder 07/26/2015  . Healthcare maintenance 07/26/2015  . Hx of radiation therapy   . Ectropion 04/15/2012  . 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 Tubular adenoma 10/24/2011  . ALLERGIC RHINITIS 09/16/2008  . DYSLIPIDEMIA 09/01/2008  . IRON DEFICIENCY ANEMIA, HX OF 08/30/2008  . PARKINSON'S DISEASE 06/30/2007  . Cerebral artery occlusion with cerebral infarction ([place]) 06/30/2007  . PERIPHERAL VASCULAR DISEASE 06/24/2006  . Convulsions ([place]) 06/24/2006    Palliative Care Assessment & Plan   Patient Profile: 86 y.o. male  with past medical history of Parkinson's, L mandible carcinoma s/p radiation tx in 2010, seizures, HTN  admitted on 10/12/2019 with worsening shortness of breath, and decreased oral intake. Workup has revealed large lung mass with pleural effusion, and possibly metastatic cancer to liver, and lytic lesions in the pelvis, thoracic spine, and cervical spine. Patient is s/p thoracentesis with approx 3 L off- pathology is pending. Palliative medicine consulted for [place].   Assessment/Recommendations/Plan   Referred for evaluation for residential hospice- with this decline, no po intake, vomiting- he does look eligible for residential hospice- will discuss  with [place] discussed with Hospice liaison and patient has been accepted to [place] and Additional Recommendations:  Limitations on Scope of Treatment: Full Comfort Care  Code Status:  DNR  Prognosis:   < 2 weeks  Discharge Planning:  Hospice facility  Care plan was discussed with patient's nurse.  Thank you for allowing the Palliative Medicine Team to assist in the care of this patient.   Time In: 1000 Time Out: 1035 Total Time 35 minutes Prolonged Time Billed no      Greater than 50%  of this time was spent counseling and coordinating care related to the above assessment and plan.  Mariana Kaufman, AGNP-C Palliative Medicine   Please contact Palliative Medicine Team phone at [phone] for questions and concerns.

## 2019-10-15 NOTE — Progress Notes (Signed)
SLP Cancellation Note  Patient Details Name: Chase Berger MRN: [id number] DOB: [birth year]   Cancelled evaluation: Pt has transitioned to full comfort care.  Spoke with daughter, Levada Dy, at bedside regarding her father's swallowing and allowing him to eat/drink the items that bring him pleasure.  No swallowing evaluation will be undertaken.  Our service will respectfully sign off.  Arda Daggs L. Tivis Ringer, [place] Office number [phone] Pager [phone]            Juan Quam Laurice 10/15/2019, 10:32 AM

## 2019-10-15 NOTE — Progress Notes (Signed)
Patient vomited again, green in color, approximately 25 mL, patient refused 12nn medication, iv prn med given. Will continue to monitor.

## 2019-10-15 NOTE — Progress Notes (Signed)
Called [employer] [facility], Report given to [place], [place]

## 2019-10-15 NOTE — Progress Notes (Signed)
Ordered patient a lunch tray but refused due to episodes of vomiting today.

## 2019-10-15 NOTE — Progress Notes (Addendum)
[facility] Documentation     Pt has been approved for [employer] transfer. [facility] does have a bed available for pt today. Paperwork has NOT  been completed yet. Liaison left pt's daughter a VM to return call to arrange paperwork. Liaison will notify hospital once paperwork is completed so that transportation can be arranged.     Please call [place] at [phone] to give charge nurse report and fax discharge summary to [phone].     Please dc any lines. May leave catheter in place if pt has one. Please send pt to [facility] with DNR paperwork.      Please call with any questions.    1:30pm addendum: Probation officer spoke with pt's daughter to confirm interest. Pt will be able to transfer once paperwork is complete. Liaison will notify TOC.    Thank you,   Freddie Breech, RN  Manufacturing engineer Documentation

## 2019-10-15 NOTE — Progress Notes (Signed)
Patient daughter at bedside and wants to speak with Md. Paged Md.

## 2019-10-15 NOTE — Social Work (Signed)
Clinical Social Worker facilitated patient discharge including contacting patient family and facility to confirm patient discharge plans.  Clinical information faxed to facility and family agreeable with plan.  CSW arranged ambulance transport via [facility] to [place] to call [phone]  with report prior to discharge.  Clinical Social Worker will sign off for now as social work intervention is no longer needed. Please consult [place] again if new need arises.  Westley Hummer, MSW, LCSW Clinical Social Worker

## 2019-10-15 NOTE — Progress Notes (Signed)
Patient vomited 1x of greenish vomitus unable to measure and with some moderate red lumps. PRN med given.

## 2019-10-15 NOTE — Progress Notes (Signed)
Patient discharged to [facility] via [facility], report given to RN to receive at the facility. Discharged papers given with patient on transport.

## 2019-10-15 NOTE — TOC Progression Note (Addendum)
Transition of Care [facility]) - Progression Note    Patient Details  Name: FREDI GEILER MRN: [id number] Date of Birth: 06-Apr-1933  Transition of Care [facility]) CM/SW [place], [place] Phone Number: 10/15/2019, 11:07 AM  Clinical Narrative: 11:42am- CSW received a message from [place] that pt has been screened/approved for [employer] eligibility. She has reached out to pt daughter Levada Dy, CSW also called and spoke with Leonard Downing with PMT.      11:07am- Referral made to [place] and Farrel Gordon for review.    Expected Discharge Plan: Hospice Medical Facility(vs. SNF with hospice) Barriers to Discharge: Continued Medical Work up, Other (comment)(Hospice Services Screen)  Expected Discharge Plan and Services Expected Discharge Plan: Hospice Medical Facility(vs. SNF with hospice) In-house Referral: Clinical Social Work Discharge Planning Services: CM Consult Post Acute Care Choice: Hospice, [place] Living arrangements for the past 2 months: Single Family Home  Readmission Risk Interventions Readmission Risk Prevention Plan 10/14/2019  Post Dischage Appt Not Complete  Appt Comments plan for comfort care  Medication Screening Complete  Transportation Screening Complete  Some recent data might be hidden

## 2019-10-16 LAB — CYTOLOGY - NON PAP

## 2019-10-20 ENCOUNTER — Ambulatory Visit: Payer: Medicare Other

## 2019-10-21 ENCOUNTER — Ambulatory Visit: Payer: Medicare Other

## 2019-11-12 ENCOUNTER — Other Ambulatory Visit: Payer: Self-pay | Admitting: Neurology

## 2019-11-12 MED ORDER — ASPIRIN-DIPYRIDAMOLE ER 25-200 MG PO CP12: 1.0000 | ORAL_CAPSULE | Freq: Two times a day (BID) | ORAL | 0 refills | Status: AC

## 2019-11-12 DEATH — deceased

## 2019-11-17 ENCOUNTER — Telehealth: Payer: Self-pay | Admitting: Neurology

## 2019-11-17 NOTE — Telephone Encounter (Signed)
Wife called called to inform that on 11/05/2019 pt passed

## 2019-11-17 NOTE — Telephone Encounter (Signed)
Noted, thanks for making aware.

## 2019-11-19 ENCOUNTER — Ambulatory Visit: Payer: Medicare Other | Admitting: Neurology

## 2019-11-26 LAB — ACID FAST CULTURE WITH REFLEXED SENSITIVITIES (MYCOBACTERIA): Acid Fast Culture: NEGATIVE
